# Patient Record
Sex: Male | Born: 1975 | Race: White | Hispanic: No | Marital: Single | Smoking: Former smoker
Health system: Southern US, Community
[De-identification: ages and names within clinical notes are randomized; demographics above are authoritative.]

## PROBLEM LIST (undated history)

## (undated) DIAGNOSIS — Z86718 Personal history of other venous thrombosis and embolism: Secondary | ICD-10-CM

## (undated) DIAGNOSIS — R7989 Other specified abnormal findings of blood chemistry: Secondary | ICD-10-CM

## (undated) DIAGNOSIS — R7303 Prediabetes: Secondary | ICD-10-CM

## (undated) DIAGNOSIS — G9332 Myalgic encephalomyelitis/chronic fatigue syndrome: Secondary | ICD-10-CM

## (undated) DIAGNOSIS — E7800 Pure hypercholesterolemia, unspecified: Secondary | ICD-10-CM

## (undated) DIAGNOSIS — L709 Acne, unspecified: Secondary | ICD-10-CM

## (undated) DIAGNOSIS — K760 Fatty (change of) liver, not elsewhere classified: Secondary | ICD-10-CM

## (undated) DIAGNOSIS — R0602 Shortness of breath: Secondary | ICD-10-CM

## (undated) DIAGNOSIS — M2550 Pain in unspecified joint: Secondary | ICD-10-CM

## (undated) DIAGNOSIS — J80 Acute respiratory distress syndrome: Secondary | ICD-10-CM

## (undated) DIAGNOSIS — M549 Dorsalgia, unspecified: Secondary | ICD-10-CM

## (undated) DIAGNOSIS — M1990 Unspecified osteoarthritis, unspecified site: Secondary | ICD-10-CM

## (undated) DIAGNOSIS — R350 Frequency of micturition: Secondary | ICD-10-CM

## (undated) DIAGNOSIS — G4730 Sleep apnea, unspecified: Secondary | ICD-10-CM

## (undated) HISTORY — DX: Personal history of other venous thrombosis and embolism: Z86.718

## (undated) HISTORY — DX: Shortness of breath: R06.02

## (undated) HISTORY — DX: Acute respiratory distress syndrome: J80

## (undated) HISTORY — DX: Other specified abnormal findings of blood chemistry: R79.89

## (undated) HISTORY — DX: Prediabetes: R73.03

## (undated) HISTORY — DX: Pain in unspecified joint: M25.50

## (undated) HISTORY — PX: CORONARY ARTERY BYPASS GRAFT: SHX141

## (undated) HISTORY — DX: Myalgic encephalomyelitis/chronic fatigue syndrome: G93.32

## (undated) HISTORY — DX: Dorsalgia, unspecified: M54.9

## (undated) HISTORY — DX: Fatty (change of) liver, not elsewhere classified: K76.0

## (undated) HISTORY — DX: Acne, unspecified: L70.9

## (undated) HISTORY — DX: Sleep apnea, unspecified: G47.30

## (undated) HISTORY — DX: Pure hypercholesterolemia, unspecified: E78.00

## (undated) HISTORY — DX: Frequency of micturition: R35.0

## (undated) HISTORY — DX: Unspecified osteoarthritis, unspecified site: M19.90

---

## 1997-12-12 HISTORY — PX: INGUINAL HERNIA REPAIR: SUR1180

## 2015-08-28 ENCOUNTER — Encounter: Payer: 59 | Admitting: Vascular Surgery

## 2017-04-22 DIAGNOSIS — R0602 Shortness of breath: Secondary | ICD-10-CM | POA: Diagnosis not present

## 2017-04-22 DIAGNOSIS — E871 Hypo-osmolality and hyponatremia: Secondary | ICD-10-CM | POA: Diagnosis not present

## 2017-04-22 DIAGNOSIS — R061 Stridor: Secondary | ICD-10-CM | POA: Diagnosis not present

## 2017-04-22 DIAGNOSIS — F17210 Nicotine dependence, cigarettes, uncomplicated: Secondary | ICD-10-CM | POA: Diagnosis not present

## 2017-04-23 DIAGNOSIS — F17210 Nicotine dependence, cigarettes, uncomplicated: Secondary | ICD-10-CM | POA: Diagnosis not present

## 2017-04-23 DIAGNOSIS — R061 Stridor: Secondary | ICD-10-CM | POA: Diagnosis not present

## 2017-04-23 DIAGNOSIS — E871 Hypo-osmolality and hyponatremia: Secondary | ICD-10-CM | POA: Diagnosis not present

## 2017-04-23 DIAGNOSIS — R0602 Shortness of breath: Secondary | ICD-10-CM | POA: Diagnosis not present

## 2017-04-24 DIAGNOSIS — R0602 Shortness of breath: Secondary | ICD-10-CM | POA: Diagnosis not present

## 2017-04-24 DIAGNOSIS — R061 Stridor: Secondary | ICD-10-CM | POA: Diagnosis not present

## 2017-04-24 DIAGNOSIS — E871 Hypo-osmolality and hyponatremia: Secondary | ICD-10-CM | POA: Diagnosis not present

## 2017-04-24 DIAGNOSIS — F17210 Nicotine dependence, cigarettes, uncomplicated: Secondary | ICD-10-CM | POA: Diagnosis not present

## 2017-04-25 ENCOUNTER — Inpatient Hospital Stay (HOSPITAL_COMMUNITY)
Admission: AD | Admit: 2017-04-25 | Discharge: 2017-05-07 | DRG: 207 | Disposition: A | Discharge status: Home / Self Care | Payer: 59 | Source: Other Acute Inpatient Hospital | Attending: Internal Medicine | Admitting: Internal Medicine

## 2017-04-25 DIAGNOSIS — Z452 Encounter for adjustment and management of vascular access device: Secondary | ICD-10-CM

## 2017-04-25 DIAGNOSIS — K760 Fatty (change of) liver, not elsewhere classified: Secondary | ICD-10-CM | POA: Diagnosis present

## 2017-04-25 DIAGNOSIS — K859 Acute pancreatitis without necrosis or infection, unspecified: Secondary | ICD-10-CM | POA: Diagnosis present

## 2017-04-25 DIAGNOSIS — F05 Delirium due to known physiological condition: Secondary | ICD-10-CM | POA: Diagnosis present

## 2017-04-25 DIAGNOSIS — E870 Hyperosmolality and hypernatremia: Secondary | ICD-10-CM | POA: Diagnosis present

## 2017-04-25 DIAGNOSIS — G9341 Metabolic encephalopathy: Secondary | ICD-10-CM | POA: Diagnosis present

## 2017-04-25 DIAGNOSIS — Z01818 Encounter for other preprocedural examination: Secondary | ICD-10-CM

## 2017-04-25 DIAGNOSIS — J9690 Respiratory failure, unspecified, unspecified whether with hypoxia or hypercapnia: Secondary | ICD-10-CM

## 2017-04-25 DIAGNOSIS — J80 Acute respiratory distress syndrome: Secondary | ICD-10-CM | POA: Diagnosis present

## 2017-04-25 DIAGNOSIS — A419 Sepsis, unspecified organism: Secondary | ICD-10-CM | POA: Diagnosis present

## 2017-04-25 DIAGNOSIS — D6489 Other specified anemias: Secondary | ICD-10-CM | POA: Diagnosis present

## 2017-04-25 DIAGNOSIS — A400 Sepsis due to streptococcus, group A: Secondary | ICD-10-CM | POA: Diagnosis not present

## 2017-04-25 DIAGNOSIS — J154 Pneumonia due to other streptococci: Secondary | ICD-10-CM | POA: Diagnosis present

## 2017-04-25 DIAGNOSIS — F17210 Nicotine dependence, cigarettes, uncomplicated: Secondary | ICD-10-CM | POA: Diagnosis not present

## 2017-04-25 DIAGNOSIS — E662 Morbid (severe) obesity with alveolar hypoventilation: Secondary | ICD-10-CM | POA: Diagnosis present

## 2017-04-25 DIAGNOSIS — F17200 Nicotine dependence, unspecified, uncomplicated: Secondary | ICD-10-CM | POA: Diagnosis present

## 2017-04-25 DIAGNOSIS — R748 Abnormal levels of other serum enzymes: Secondary | ICD-10-CM | POA: Diagnosis not present

## 2017-04-25 DIAGNOSIS — Z6839 Body mass index (BMI) 39.0-39.9, adult: Secondary | ICD-10-CM

## 2017-04-25 DIAGNOSIS — Z86718 Personal history of other venous thrombosis and embolism: Secondary | ICD-10-CM | POA: Diagnosis not present

## 2017-04-25 DIAGNOSIS — Z4659 Encounter for fitting and adjustment of other gastrointestinal appliance and device: Secondary | ICD-10-CM

## 2017-04-25 DIAGNOSIS — R0602 Shortness of breath: Secondary | ICD-10-CM | POA: Diagnosis not present

## 2017-04-25 DIAGNOSIS — Z881 Allergy status to other antibiotic agents status: Secondary | ICD-10-CM

## 2017-04-25 DIAGNOSIS — G4733 Obstructive sleep apnea (adult) (pediatric): Secondary | ICD-10-CM | POA: Diagnosis not present

## 2017-04-25 DIAGNOSIS — Z978 Presence of other specified devices: Secondary | ICD-10-CM

## 2017-04-25 DIAGNOSIS — Z781 Physical restraint status: Secondary | ICD-10-CM

## 2017-04-25 DIAGNOSIS — E871 Hypo-osmolality and hyponatremia: Secondary | ICD-10-CM | POA: Diagnosis not present

## 2017-04-25 DIAGNOSIS — J189 Pneumonia, unspecified organism: Secondary | ICD-10-CM

## 2017-04-25 DIAGNOSIS — J9601 Acute respiratory failure with hypoxia: Secondary | ICD-10-CM | POA: Diagnosis not present

## 2017-04-25 DIAGNOSIS — E876 Hypokalemia: Secondary | ICD-10-CM | POA: Diagnosis present

## 2017-04-25 DIAGNOSIS — D490 Neoplasm of unspecified behavior of digestive system: Secondary | ICD-10-CM

## 2017-04-25 DIAGNOSIS — K7689 Other specified diseases of liver: Secondary | ICD-10-CM | POA: Diagnosis present

## 2017-04-25 DIAGNOSIS — J181 Lobar pneumonia, unspecified organism: Secondary | ICD-10-CM | POA: Diagnosis not present

## 2017-04-25 DIAGNOSIS — J020 Streptococcal pharyngitis: Secondary | ICD-10-CM | POA: Diagnosis present

## 2017-04-25 DIAGNOSIS — R061 Stridor: Secondary | ICD-10-CM | POA: Diagnosis not present

## 2017-04-25 DIAGNOSIS — R160 Hepatomegaly, not elsewhere classified: Secondary | ICD-10-CM | POA: Diagnosis not present

## 2017-04-25 MED ORDER — FENTANYL CITRATE (PF) 100 MCG/2ML IJ SOLN: 100.0000 ug | INTRAMUSCULAR | Status: DC | PRN

## 2017-04-25 MED ORDER — PROPOFOL 1000 MG/100ML IV EMUL
0.0000 ug/kg/min | INTRAVENOUS | Status: DC
  Administered 2017-04-26: 30 ug/kg/min via INTRAVENOUS

## 2017-04-25 MED ORDER — HEPARIN SODIUM (PORCINE) 5000 UNIT/ML IJ SOLN
5000.0000 [IU] | Freq: Three times a day (TID) | INTRAMUSCULAR | Status: DC
  Administered 2017-04-26 – 2017-05-01 (×16): 5000 [IU] via SUBCUTANEOUS
  Filled 2017-04-25 (×18): qty 1

## 2017-04-25 MED ORDER — SODIUM CHLORIDE 0.9 % IV SOLN
250.0000 mL | INTRAVENOUS | Status: DC | PRN
  Administered 2017-04-27 – 2017-04-29 (×2): 250 mL via INTRAVENOUS

## 2017-04-25 MED ORDER — PANTOPRAZOLE SODIUM 40 MG IV SOLR
40.0000 mg | Freq: Every day | INTRAVENOUS | Status: DC
  Administered 2017-04-26 – 2017-04-27 (×2): 40 mg via INTRAVENOUS
  Filled 2017-04-25 (×3): qty 40

## 2017-04-25 NOTE — H&P (Signed)
PULMONARY / CRITICAL CARE MEDICINE   Name: Richard Dyer MRN: [id number] DOB: 01-May-1975    ADMISSION DATE:  04/25/2017 CONSULTATION DATE:  04/25/2017  REFERRING MD:  [facility]   CHIEF COMPLAINT:  Hypoxic Respiratory Failure   HISTORY OF PRESENT ILLNESS:   42 year old male current smoker with PMH of remote DVT.   Presented to ED on 3/12 with one day of fever, sore throat, dyspnea. In ED Tmax 102, rapid strep positive, flu negative. Family noted patient has had recent exposure to flu. CT neck with no peritonsillar abscess, however, patient stridorous. Intubated. Placed on ceftriaxone/clindamycin and dexamethasone. Extubated after 36 hours, however later that evening became hypoxic and tachypneic requiring re-intubation. CT chest with dense bilateral airspace disease with air bronchograms. Transferred to [facility] for further work-up.    PAST MEDICAL HISTORY :  He  has no past medical history on file.  PAST SURGICAL HISTORY: He  has no past surgical history on file.  Allergies not on file  No current facility-administered medications on file prior to encounter.    No current outpatient medications on file prior to encounter.    FAMILY HISTORY:  His has no family status information on file.    SOCIAL HISTORY: He    REVIEW OF SYSTEMS:   Unable to review as patient is intubated and sedated   SUBJECTIVE:   VITAL SIGNS: BP 110/71   Pulse 97   Temp (!) 101.1 F (38.4 C)   Resp (!) 24   Ht 5\' 10"  (1.778 m)   SpO2 92%   HEMODYNAMICS:    VENTILATOR SETTINGS: Vent Mode: PRVC FiO2 (%):  [80 %] 80 % Set Rate:  [18 bmp] 18 bmp PEEP:  [12 cmH20] 12 cmH20 Plateau Pressure:  [29 cmH20] 29 cmH20  INTAKE / OUTPUT: No intake/output data recorded.  PHYSICAL EXAMINATION: General:  Adult male, on vent  Neuro:  Sedated, pupils intact HEENT:  ETT in place, dry MM  Cardiovascular:  RRR, no MRG  Lungs:  Diminished breath sounds, no wheeze  Abdomen:  Obese, active  bowel sounds  Musculoskeletal:  -edema,  Skin:  Warm, dry   LABS:  BMET No results for input(s): NA, K, CL, CO2, BUN, CREATININE, GLUCOSE in the last 168 hours.  Electrolytes No results for input(s): CALCIUM, MG, PHOS in the last 168 hours.  CBC No results for input(s): WBC, HGB, HCT, PLT in the last 168 hours.  Coag's No results for input(s): APTT, INR in the last 168 hours.  Sepsis Markers No results for input(s): LATICACIDVEN, PROCALCITON, O2SATVEN in the last 168 hours.  ABG No results for input(s): PHART, PCO2ART, PO2ART in the last 168 hours.  Liver Enzymes No results for input(s): AST, ALT, ALKPHOS, BILITOT, ALBUMIN in the last 168 hours.  Cardiac Enzymes No results for input(s): TROPONINI, PROBNP in the last 168 hours.  Glucose No results for input(s): GLUCAP in the last 168 hours.  Imaging No results found.   STUDIES:  CT Chest 3/15 > Dense Confluent Bilateral airspace disease with air bronchograms is suggestive of severe pulmonary edema/ARDS. Diffuse pulmonary hemorrhage or infection could have similar pattern, no mediastinal lymphadenopathy, no mass in the airway or lung   CULTURES: Blood 3/16 >> Sputum 3/16 >>  U/A 3/16 >>   ANTIBIOTICS: Ceftriaxone 3/12 >3/15 Clindamycin 3/12 >3/15 Vancomycin 3/15 >>   Cefepime 3/15 >> Azithromycin 3/15 >>   SIGNIFICANT EVENTS: 3/12 > Presented to ED, Intubated  3/14 > Extubated  3/15 > Re-intubated  3/15 > Transferred to [facility]   LINES/TUBES: ETT 3/12 > 3/14 ETT 3/14 >>   DISCUSSION: Presented to ED on 3/12 with one day of fever, sore throat, dyspnea. In ED Tmax 102, rapid strep positive, flu negative. CT neck with no peritonsillar abscess, however, patient stridorous. Intubated. Placed on ceftriaxone/clindamycin and dexamethasone. Extubated after 36 hours, however later that evening became hypoxic and tachypneic requiring re-intubation. CT chest with dense bilateral airspace disease with air  bronchograms.   ASSESSMENT / PLAN:  PULMONARY A: Acute Hypoxic Respiratory Failure in setting of Severe ARDS secondary to presumed CAP, +Strep A with noted pharyngitis  PO2/Fi02 = 100  H/O Tobacco Abuse  P:   Vent Support > 6cc/kg  Trend ABG/CXR Pulmonary Hygiene  RVP pending   CARDIOVASCULAR A:  H/O DVT - Wells Score 9.0  P:  Cardiac Monitoring  Maintain MAP > 65 ECHO/LE Dopplers pending CT PE pending   RENAL A:   Hypokalemia  P:   Trend BMP  Replace electrolytes as indicated > Replace K   GASTROINTESTINAL A:   SUP P:   NPO PPI  HEMATOLOGIC A:   DVT Prophylaxis  P:  Trend CBC  SCDs  Heparin SQ  INFECTIOUS A:   Rapid Strep A positive (OSH believed this was a colonization)  Febrile, Leukocytosis  P:   Trend WBC and Fever  Trend LA and PCT  PAN Culture  RVP pending   ENDOCRINE A:   No issues    P:   Trend Glucose   NEUROLOGIC A:   Sedation Needs  P:   RASS goal: 0/-1 Wean Fentanyl gtt to achieve RASS PRN Versed    FAMILY  - Updates: Wife updated at bedside   - Inter-disciplinary family meet or Palliative Care meeting due by:  3/23     Hayden Pedro, AGACNP-BC [place]  Pgr: [phone]  PCCM Pgr: [phone]

## 2017-04-26 ENCOUNTER — Inpatient Hospital Stay (HOSPITAL_COMMUNITY): Payer: 59

## 2017-04-26 DIAGNOSIS — Z978 Presence of other specified devices: Secondary | ICD-10-CM

## 2017-04-26 DIAGNOSIS — R0602 Shortness of breath: Secondary | ICD-10-CM

## 2017-04-26 DIAGNOSIS — J80 Acute respiratory distress syndrome: Secondary | ICD-10-CM

## 2017-04-26 LAB — GLUCOSE, CAPILLARY
GLUCOSE-CAPILLARY: 113 mg/dL — AB (ref 65–99)
GLUCOSE-CAPILLARY: 130 mg/dL — AB (ref 65–99)
Glucose-Capillary: 131 mg/dL — ABNORMAL HIGH (ref 65–99)
Glucose-Capillary: 119 mg/dL — ABNORMAL HIGH (ref 65–99)
Glucose-Capillary: 145 mg/dL — ABNORMAL HIGH (ref 65–99)

## 2017-04-26 LAB — BLOOD GAS, ARTERIAL
ACID-BASE EXCESS: 5 mmol/L — AB (ref 0.0–2.0)
ACID-BASE EXCESS: 6.1 mmol/L — AB (ref 0.0–2.0)
Acid-Base Excess: 4.9 mmol/L — ABNORMAL HIGH (ref 0.0–2.0)
BICARBONATE: 29.4 mmol/L — AB (ref 20.0–28.0)
Bicarbonate: 30.1 mmol/L — ABNORMAL HIGH (ref 20.0–28.0)
Bicarbonate: 29 mmol/L — ABNORMAL HIGH (ref 20.0–28.0)
DRAWN BY: 414221
Drawn by: 51702
Drawn by: 414221
FIO2: 60
FIO2: 70
FIO2: 70
LHR: 18 {breaths}/min
MECHVT: 580 mL
O2 SAT: 94.6 %
O2 Saturation: 89.8 %
O2 Saturation: 95 %
PCO2 ART: 46.7 mmHg (ref 32.0–48.0)
PEEP/CPAP: 12 cmH2O
PEEP: 12 cmH2O
PEEP: 12 cmH2O
PH ART: 7.458 — AB (ref 7.350–7.450)
Patient temperature: 98.6
Patient temperature: 100.8
Patient temperature: 102.4
RATE: 18 resp/min
RATE: 18 resp/min
VT: 440 mL
VT: 440 mL
pCO2 arterial: 43 mmHg (ref 32.0–48.0)
pCO2 arterial: 51.9 mmHg — ABNORMAL HIGH (ref 32.0–48.0)
pH, Arterial: 7.417 (ref 7.350–7.450)
pH, Arterial: 7.384 (ref 7.350–7.450)
pO2, Arterial: 69.9 mmHg — ABNORMAL LOW (ref 83.0–108.0)
pO2, Arterial: 61 mmHg — ABNORMAL LOW (ref 83.0–108.0)
pO2, Arterial: 85.8 mmHg (ref 83.0–108.0)

## 2017-04-26 LAB — LACTIC ACID, PLASMA: LACTIC ACID, VENOUS: 1.2 mmol/L (ref 0.5–1.9)

## 2017-04-26 LAB — URINALYSIS, ROUTINE W REFLEX MICROSCOPIC
BILIRUBIN URINE: NEGATIVE
Glucose, UA: NEGATIVE mg/dL
Ketones, ur: NEGATIVE mg/dL
LEUKOCYTES UA: NEGATIVE
NITRITE: NEGATIVE
PH: 5 (ref 5.0–8.0)
Protein, ur: 100 mg/dL — AB
SPECIFIC GRAVITY, URINE: 1.035 — AB (ref 1.005–1.030)

## 2017-04-26 LAB — CBC
HCT: 43.3 % (ref 39.0–52.0)
Hemoglobin: 15.4 g/dL (ref 13.0–17.0)
MCH: 31.4 pg (ref 26.0–34.0)
MCHC: 35.6 g/dL (ref 30.0–36.0)
MCV: 88.2 fL (ref 78.0–100.0)
PLATELETS: 246 10*3/uL (ref 150–400)
RBC: 4.91 MIL/uL (ref 4.22–5.81)
RDW: 13.2 % (ref 11.5–15.5)
WBC: 15.7 10*3/uL — ABNORMAL HIGH (ref 4.0–10.5)

## 2017-04-26 LAB — BASIC METABOLIC PANEL
ANION GAP: 15 (ref 5–15)
BUN: 15 mg/dL (ref 6–20)
CALCIUM: 7.9 mg/dL — AB (ref 8.9–10.3)
CO2: 25 mmol/L (ref 22–32)
CREATININE: 1.19 mg/dL (ref 0.61–1.24)
Chloride: 97 mmol/L — ABNORMAL LOW (ref 101–111)
GFR calc Af Amer: >60 mL/min (ref >60)
GLUCOSE: 116 mg/dL — AB (ref 65–99)
Potassium: 3.2 mmol/L — ABNORMAL LOW (ref 3.5–5.1)
Sodium: 137 mmol/L (ref 135–145)

## 2017-04-26 LAB — RESPIRATORY PANEL BY PCR
ADENOVIRUS-RVPPCR: NOT DETECTED
Bordetella pertussis: NOT DETECTED
CHLAMYDOPHILA PNEUMONIAE-RVPPCR: NOT DETECTED
CORONAVIRUS NL63-RVPPCR: NOT DETECTED
CORONAVIRUS OC43-RVPPCR: NOT DETECTED
Coronavirus 229E: NOT DETECTED
Coronavirus HKU1: NOT DETECTED
INFLUENZA A-RVPPCR: NOT DETECTED
Influenza B: NOT DETECTED
Metapneumovirus: NOT DETECTED
Mycoplasma pneumoniae: NOT DETECTED
PARAINFLUENZA VIRUS 3-RVPPCR: NOT DETECTED
Parainfluenza Virus 1: NOT DETECTED
Parainfluenza Virus 2: NOT DETECTED
Parainfluenza Virus 4: NOT DETECTED
RESPIRATORY SYNCYTIAL VIRUS-RVPPCR: NOT DETECTED
Rhinovirus / Enterovirus: NOT DETECTED

## 2017-04-26 LAB — TRIGLYCERIDES: Triglycerides: 241 mg/dL — ABNORMAL HIGH (ref <150)

## 2017-04-26 LAB — HEPATIC FUNCTION PANEL
ALT: 19 U/L (ref 17–63)
AST: 19 U/L (ref 15–41)
Albumin: 3.1 g/dL — ABNORMAL LOW (ref 3.5–5.0)
Alkaline Phosphatase: 53 U/L (ref 38–126)
BILIRUBIN DIRECT: 0.5 mg/dL (ref 0.1–0.5)
BILIRUBIN INDIRECT: 1 mg/dL — AB (ref 0.3–0.9)
BILIRUBIN TOTAL: 1.5 mg/dL — AB (ref 0.3–1.2)
TOTAL PROTEIN: 6.8 g/dL (ref 6.5–8.1)

## 2017-04-26 LAB — ECHOCARDIOGRAM COMPLETE
HEIGHTINCHES: 70 in
Weight: 4409.2 oz

## 2017-04-26 LAB — PROCALCITONIN: Procalcitonin: 0.48 ng/mL

## 2017-04-26 LAB — MRSA PCR SCREENING: MRSA BY PCR: NEGATIVE

## 2017-04-26 LAB — STREP PNEUMONIAE URINARY ANTIGEN: STREP PNEUMO URINARY ANTIGEN: NEGATIVE

## 2017-04-26 LAB — MAGNESIUM: MAGNESIUM: 2.2 mg/dL (ref 1.7–2.4)

## 2017-04-26 LAB — HIV ANTIBODY (ROUTINE TESTING W REFLEX): HIV Screen 4th Generation wRfx: NONREACTIVE

## 2017-04-26 LAB — PHOSPHORUS: PHOSPHORUS: 3.2 mg/dL (ref 2.5–4.6)

## 2017-04-26 LAB — TROPONIN I: Troponin I: <0.03 ng/mL (ref <0.03)

## 2017-04-26 MED ORDER — PENICILLIN G BENZATHINE 1200000 UNIT/2ML IM SUSP
1.2000 10*6.[IU] | Freq: Once | INTRAMUSCULAR | Status: AC
  Administered 2017-04-26: 1.2 10*6.[IU] via INTRAMUSCULAR
  Filled 2017-04-26: qty 2

## 2017-04-26 MED ORDER — MIDAZOLAM HCL 2 MG/2ML IJ SOLN
2.0000 mg | INTRAMUSCULAR | Status: DC | PRN
  Administered 2017-04-26 – 2017-04-27 (×3): 2 mg via INTRAVENOUS
  Filled 2017-04-26 (×3): qty 2

## 2017-04-26 MED ORDER — ACETAMINOPHEN 160 MG/5ML PO SOLN
650.0000 mg | Freq: Four times a day (QID) | ORAL | Status: DC | PRN
  Administered 2017-04-26 – 2017-04-29 (×4): 650 mg
  Filled 2017-04-26 (×4): qty 20.3

## 2017-04-26 MED ORDER — SODIUM CHLORIDE 0.9 % IV SOLN
INTRAVENOUS | Status: DC | PRN
  Administered 2017-05-01 – 2017-05-04 (×2): via INTRA_ARTERIAL

## 2017-04-26 MED ORDER — SODIUM CHLORIDE 0.9 % IV BOLUS (SEPSIS)
1000.0000 mL | Freq: Once | INTRAVENOUS | Status: AC
  Administered 2017-04-26: 1000 mL via INTRAVENOUS

## 2017-04-26 MED ORDER — DIPHENHYDRAMINE HCL 50 MG/ML IJ SOLN
25.0000 mg | Freq: Three times a day (TID) | INTRAMUSCULAR | Status: DC
  Administered 2017-04-26 – 2017-04-27 (×4): 25 mg via INTRAVENOUS
  Filled 2017-04-26: qty 1
  Filled 2017-04-26 (×5): qty 0.5
  Filled 2017-04-26: qty 1
  Filled 2017-04-26 (×2): qty 0.5
  Filled 2017-04-26: qty 1

## 2017-04-26 MED ORDER — FENTANYL BOLUS VIA INFUSION
50.0000 ug | INTRAVENOUS | Status: DC | PRN
  Administered 2017-04-26 – 2017-04-27 (×2): 50 ug via INTRAVENOUS
  Filled 2017-04-26: qty 50

## 2017-04-26 MED ORDER — IOPAMIDOL (ISOVUE-370) INJECTION 76%
INTRAVENOUS | Status: AC
  Administered 2017-04-26: 100 mL
  Filled 2017-04-26: qty 100

## 2017-04-26 MED ORDER — FENTANYL CITRATE (PF) 100 MCG/2ML IJ SOLN
50.0000 ug | Freq: Once | INTRAMUSCULAR | Status: AC
  Administered 2017-04-26: 50 ug via INTRAVENOUS
  Filled 2017-04-26 (×2): qty 2

## 2017-04-26 MED ORDER — CHLORHEXIDINE GLUCONATE 0.12% ORAL RINSE (MEDLINE KIT)
15.0000 mL | Freq: Two times a day (BID) | OROMUCOSAL | Status: DC
  Administered 2017-04-26 – 2017-05-02 (×13): 15 mL via OROMUCOSAL

## 2017-04-26 MED ORDER — ORAL CARE MOUTH RINSE
15.0000 mL | OROMUCOSAL | Status: DC
  Administered 2017-04-26 – 2017-05-02 (×64): 15 mL via OROMUCOSAL

## 2017-04-26 MED ORDER — VANCOMYCIN HCL 10 G IV SOLR
1250.0000 mg | Freq: Three times a day (TID) | INTRAVENOUS | Status: DC
  Administered 2017-04-26 – 2017-04-27 (×4): 1250 mg via INTRAVENOUS
  Filled 2017-04-26 (×5): qty 1250

## 2017-04-26 MED ORDER — POTASSIUM CHLORIDE 20 MEQ/15ML (10%) PO SOLN
40.0000 meq | Freq: Once | ORAL | Status: AC
  Administered 2017-04-26: 40 meq via ORAL
  Filled 2017-04-26: qty 30

## 2017-04-26 MED ORDER — PRO-STAT SUGAR FREE PO LIQD
30.0000 mL | Freq: Every day | ORAL | Status: DC
  Administered 2017-04-26 – 2017-05-02 (×7): 30 mL
  Filled 2017-04-26 (×7): qty 30

## 2017-04-26 MED ORDER — VITAL HIGH PROTEIN PO LIQD
1000.0000 mL | ORAL | Status: DC
  Administered 2017-04-26 – 2017-05-01 (×11): 1000 mL
  Filled 2017-04-26 (×4): qty 1000

## 2017-04-26 MED ORDER — MIDAZOLAM HCL 2 MG/2ML IJ SOLN: 2.0000 mg | INTRAMUSCULAR | Status: DC | PRN

## 2017-04-26 MED ORDER — FENTANYL 2500MCG IN NS 250ML (10MCG/ML) PREMIX INFUSION
25.0000 ug/h | INTRAVENOUS | Status: DC
  Administered 2017-04-26: 25 ug/h via INTRAVENOUS
  Administered 2017-04-27 (×2): 300 ug/h via INTRAVENOUS
  Administered 2017-04-27: 400 ug/h via INTRAVENOUS
  Filled 2017-04-26 (×3): qty 250

## 2017-04-26 MED ORDER — PIPERACILLIN-TAZOBACTAM 3.375 G IVPB
3.3750 g | Freq: Three times a day (TID) | INTRAVENOUS | Status: AC
  Administered 2017-04-26 – 2017-05-01 (×18): 3.375 g via INTRAVENOUS
  Filled 2017-04-26 (×18): qty 50

## 2017-04-26 NOTE — Progress Notes (Signed)
  Echocardiogram 2D Echocardiogram has been performed.  Richard Dyer 04/26/2017, 9:14 AM

## 2017-04-26 NOTE — Progress Notes (Signed)
Pharmacy Antibiotic Note  Richard Dyer is a 42 y.o. male admitted on 04/25/2017 with respiratory failure/ARDS.  Pharmacy has been consulted for Vancomycin and Zosyn dosing.  Previously receiving Vancomycin Cefepime and Azithromycin at [facility]. Last dose of Vancomycin 2 g IV given at 1300 3/15.   Plan: Vancomycin 1250 mg IV q8h, premedicate with Benadryl ddue to h/o infusion reaction Zosyn 3.375 g IV q8h    Height: 5\' 10"  (177.8 cm) Weight: 277 lb 1.9 oz (125.7 kg) IBW/kg (Calculated) : 73  Temp (24hrs), Avg:100.8 F (38.2 C), Min:100.6 F (38.1 C), Max:101.1 F (38.4 C)  Recent Labs  Lab 04/26/17 0037 04/26/17 0039  WBC  --  15.7*  CREATININE  --  1.19  LATICACIDVEN 1.2  --     Estimated Creatinine Clearance: 110.9 mL/min (by C-G formula based on SCr of 1.19 mg/dL).    Allergies  Allergen Reactions  . 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

## 2017-04-26 NOTE — Progress Notes (Addendum)
PULMONARY / CRITICAL CARE MEDICINE   Name: Richard Dyer MRN: [id number] DOB: 08-22-1975    ADMISSION DATE:  04/25/2017 CONSULTATION DATE:  04/25/2017  REFERRING MD:  [facility]   CHIEF COMPLAINT:  Hypoxic Respiratory Failure   HISTORY OF PRESENT ILLNESS:   42 year old male current smoker with PMH of remote DVT.   Presented to ED on 3/12 with one day of fever, sore throat, dyspnea. In ED Tmax 102, rapid strep positive, flu negative. Family noted patient has had recent exposure to flu. CT neck with no peritonsillar abscess, however, patient stridorous. Intubated. Placed on ceftriaxone/clindamycin and dexamethasone. Extubated after 36 hours, however later that evening became hypoxic and tachypneic requiring re-intubation. CT chest with dense bilateral airspace disease with air bronchograms. Transferred to [facility] for further work-up.      SUBJECTIVE:  No male who is on low stretch via ARDS protocol currently stable VITAL SIGNS: BP 140/67   Pulse 81   Temp (!) 100.9 F (38.3 C)   Resp (!) 28   Ht 5\' 10"  (1.778 m)   Wt 125 kg (275 lb 9.2 oz)   SpO2 90%   BMI 39.54 kg/m   HEMODYNAMICS:    VENTILATOR SETTINGS: Vent Mode: PRVC FiO2 (%):  [60 %-80 %] 60 % Set Rate:  [18 bmp] 18 bmp Vt Set:  [440 mL-580 mL] 440 mL PEEP:  [12 cmH20] 12 cmH20 Plateau Pressure:  [25 cmH20-29 cmH20] 25 cmH20  INTAKE / OUTPUT: I/O last 3 completed shifts: In: 1413.7 [I.V.:113.7; IV [id number]:7425] Out: 956 [Urine:495]  PHYSICAL EXAMINATION: General: Obese male in no acute distress HEENT: Endotracheal tube and orogastric tube in place, pupils equal reactive PSY: Sedated Neuro: Follows commands moves all extremities CV: Heart sounds are regular S1s2 rrr, no m/r/g PULM: Coarse rhonchi with faint expiratory wheezes LO:VFIE, non-tender, bsx4 active  Extremities: warm/dry, 1+ edema  Skin: no rashes or lesions    LABS:  BMET Recent Labs  Lab 04/26/17 0039  NA 137  K 3.2*  CL  97*  CO2 25  BUN 15  CREATININE 1.19  GLUCOSE 116*    Electrolytes Recent Labs  Lab 04/26/17 0039  CALCIUM 7.9*  MG 2.2  PHOS 3.2    CBC Recent Labs  Lab 04/26/17 0039  WBC 15.7*  HGB 15.4  HCT 43.3  PLT 246    Coag's No results for input(s): APTT, INR in the last 168 hours.  Sepsis Markers Recent Labs  Lab 04/26/17 0037  LATICACIDVEN 1.2  PROCALCITON 0.48    ABG Recent Labs  Lab 04/25/17 0040 04/26/17 0437  PHART 7.458* 7.417  PCO2ART 43.0 46.7  PO2ART 69.9* 61.0*    Liver Enzymes Recent Labs  Lab 04/26/17 0037  AST 19  ALT 19  ALKPHOS 53  BILITOT 1.5*  ALBUMIN 3.1*    Cardiac Enzymes Recent Labs  Lab 04/26/17 0037  TROPONINI <0.03    Glucose Recent Labs  Lab 04/26/17 0406 04/26/17 0831 04/26/17 1215  GLUCAP 131* 119* 113*    Imaging Ct Angio Chest Pe W Or Wo Contrast  Result Date: 04/26/2017 CLINICAL DATA:  Acute onset of hypoxic respiratory failure. EXAM: CT ANGIOGRAPHY CHEST WITH CONTRAST TECHNIQUE: Multidetector CT imaging of the chest was performed using the standard protocol during bolus administration of intravenous contrast. Multiplanar CT image reconstructions and MIPs were obtained to evaluate the vascular anatomy. CONTRAST:  112mL ISOVUE-370 IOPAMIDOL (ISOVUE-370) INJECTION 76% COMPARISON:  Chest radiograph performed earlier today at 12:00 a.m. FINDINGS: Cardiovascular: There is  no evidence of central pulmonary embolus. Evaluation for pulmonary embolus is suboptimal in areas of airspace consolidation. The heart is borderline normal in size. The thoracic aorta is grossly unremarkable. The great vessels are within normal limits. Mediastinum/Nodes: The mediastinum is grossly unremarkable in appearance. No mediastinal lymphadenopathy is seen. Trace pericardial fluid remains within normal limits. The visualized portions of the thyroid gland are unremarkable. No axillary lymphadenopathy is seen. The patient's endotracheal tube is seen  ending 4 cm above the carina. Lungs/Pleura: Diffuse bilateral airspace opacification is noted, with additional hazy airspace opacity in the expanded portions of the lungs. This may reflect pulmonary edema or pneumonia. Given clinical presentation, ARDS is a concern. No definite pleural effusion or pneumothorax is seen. Upper Abdomen: The visualized portions of the liver and spleen are unremarkable. The visualized portions of the pancreas, gallbladder, adrenal glands and kidneys are within normal limits. An enteric tube is noted extending to the body of the stomach. Musculoskeletal: No acute osseous abnormalities are identified. The visualized musculature is unremarkable in appearance. Review of the MIP images confirms the above findings. IMPRESSION: 1. No evidence of central pulmonary embolus. 2. Diffuse bilateral airspace opacification, with additional hazy airspace opacity in the expanded portions of the lungs. This may reflect pulmonary edema or pneumonia. Given clinical presentation, ARDS is a concern. Electronically Signed   By: Garald Balding M.D.   On: 04/26/2017 03:30   Dg Chest Port 1 View  Result Date: 04/26/2017 CLINICAL DATA:  Endotracheal and orogastric tube placement EXAM: PORTABLE CHEST 1 VIEW COMPARISON:  None. FINDINGS: There is moderate cardiomegaly with multifocal bilateral airspace opacities. The endotracheal tube tip is at the level of the clavicular heads. Orogastric tube side port overlies the gastric body. IMPRESSION: 1. Endotracheal tube and orogastric tube in radiographically appropriate position. 2. Moderate cardiomegaly with multifocal bilateral airspace opacities which may indicate multifocal infection or pulmonary edema. Electronically Signed   By: Ulyses Jarred M.D.   On: 04/26/2017 00:25     STUDIES:  CT Chest 3/15 > Dense Confluent Bilateral airspace disease with air bronchograms is suggestive of severe pulmonary edema/ARDS. Diffuse pulmonary hemorrhage or infection could  have similar pattern, no mediastinal lymphadenopathy, no mass in the airway or lung   CULTURES: Blood 3/16 >> Sputum 3/16 >>  U/A 3/16 >>   ANTIBIOTICS: Ceftriaxone 3/12 >3/15 Clindamycin 3/12 >3/15 Vancomycin 3/15 >>   Cefepime 3/15 >> Azithromycin 3/15 >>   SIGNIFICANT EVENTS: 3/12 > Presented to ED, Intubated  3/14 > Extubated  3/15 > Re-intubated  3/15 > Transferred to [facility]   LINES/TUBES: ETT 3/12 > 3/14 ETT 3/14 >>   DISCUSSION: Presented to ED on 3/12 with one day of fever, sore throat, dyspnea. In ED Tmax 102, rapid strep positive, flu negative. CT neck with no peritonsillar abscess, however, patient stridorous. Intubated. Placed on ceftriaxone/clindamycin and dexamethasone. Extubated after 36 hours, however later that evening became hypoxic and tachypneic requiring re-intubation. CT chest with dense bilateral airspace disease with air bronchograms.  Awake and follows commands despite sedation.  He has a plateau pressure of 34 despite being on 6 cc/kg  ASSESSMENT / PLAN:  PULMONARY A: Acute Hypoxic Respiratory Failure in setting of Severe ARDS secondary to presumed CAP, +Strep A with noted pharyngitis  PO2/Fi02 = 100  H/O Tobacco Abuse  CT scan consistent with ARDS P:   Bundle goal 6 cc/kg Antibiotics Diuresis as tolerated  CARDIOVASCULAR A:  H/O DVT - Wells Score 9.0  P:  Cardiac monitor Maintain  adequate blood pressure No PE by CTA    RENAL Lab Results  Component Value Date   CREATININE 1.19 04/26/2017   Recent Labs  Lab 04/26/17 0039  K 3.2*    A:   Hypokalemia  P:   Follow electrolytes Replete potassium  GASTROINTESTINAL A:   SUP P:   N.p.o. PPI Start tube feeding  HEMATOLOGIC Recent Labs    04/26/17 0039  HGB 15.4    A:   DVT Prophylaxis  P:  Follow CBC Subcu heparin  INFECTIOUS A:   Rapid Strep A positive (OSH believed this was a colonization)  Febrile, Leukocytosis  P:   Trend WBC and Fever  Lactic acid  1.2 Pro-calcitonin 0.48 Pan culture RVP negative Antibiotics of Zosyn and vancomycin started on 04/26/2017  ENDOCRINE CBG (last 3)  Recent Labs    04/26/17 0406 04/26/17 0831 04/26/17 1215  GLUCAP 131* 119* 113*    A:   No issues    P:   Plan scale insulin  NEUROLOGIC A:   Sedation Needs  P:   Interventions sedation scale goal of 0 to -1 04/26/2017 awake and follows commands   FAMILY  - Updates: 04/26/2017 wife updated at bedside  - Inter-disciplinary family meet or Palliative Care meeting due by:  3/23   NP critical care time 30 minutes   Richardson Landry Minor ACNP Maryanna Shape PCCM Pager [phone] till 1 pm If no answer page [phone]- [phone] 04/26/2017, 2:09 PM   Attending Note:  I have examined patient, reviewed labs, studies and notes. I have discussed the case with S Minor, and I agree with the data and plans as amended above.  42 year old man history of tobacco use and remote DVT.  Admitted with sore throat and suspected strep pharyngitis, stridor.  Noted to have evolving respiratory failure and then subsequently dense airspace disease.  Question pneumonia versus acute lung injury, question negative pressure pulmonary edema in the setting of upper airway obstruction.  He is intubated and ventilated.  Started on vancomycin, azithromycin, cefepime to cover possible severe pneumonia. Vitals:   04/26/17 1200 04/26/17 1300 04/26/17 1400 04/26/17 1500  BP:      Pulse: 82 81 85 84  Resp: (!) 33 (!) 28 (!) 28 (!) 31  Temp: (!) 100.9 F (38.3 C) (!) 100.9 F (38.3 C) (!) 101.1 F (38.4 C) (!) 101.3 F (38.5 C)  TempSrc:      SpO2: 91% 90% 93% 93%  Weight:      Height:      On evaluation he is a somewhat overweight man ventilated and in no distress.  Endotracheal tube is in good position pupils are equal and reactive.  He is sedated but will open eyes, follow commands and move all extremities.  Lungs are coarse bilaterally with some faint bilateral expiratory wheezes.  Heart  regular without a murmur, abdomen soft nontender with positive bowel sounds.  He has 1+ lower extremity edema.  We will continue current ventilator strategy with low tidal volume ventilation, 6 cc/kg.  Wean PEEP and FiO2 as able.  Empiric diuretics today to drive down CVP.  Continue current antibiotics and adjust based on culture data.   Independent critical care time is 33 minutes.   Baltazar Apo, MD, PhD 04/26/2017, 4:00 PM [facility] [phone] or if no answer [phone]

## 2017-04-26 NOTE — Progress Notes (Signed)
Initial Nutrition Assessment  DOCUMENTATION CODES:  Obesity unspecified  INTERVENTION:  Initiate TF via OGT with Vital High Protein at goal rate of 65 ml/h (1560 ml per day) and Prostat 30 ml Q24 to provide 1660 kcals, 152 gm protein, 1304 ml free water daily.  NUTRITION DIAGNOSIS:  Inadequate oral intake related to inability to eat as evidenced by NPO status.  GOAL:  Provide needs based on ASPEN/SCCM guidelines  MONITOR:  Vent status, Labs, Weight trends, Diet advancement, TF tolerance  REASON FOR ASSESSMENT:  Ventilator    ASSESSMENT:  42 y/o obese male. Presented to OSH 3/12 w/ 1 day fever, sore throat, dyspnea. Had Tmax 102. Intubated, but successfully extubated after 36 hrs. However, there after developed hypoxia, tachypnea and required reintubation. Tx to [facility]. Pt w/ severe ARDS 2/2 presumed CAP +Strep A.   Family present on RD arrival. Also, Patient is alert and well oriented on ventilator. Richard Dyer states that Richard Dyer did not received TF while intubated at [facility], however Richard Dyer did have 1 meal after Richard Dyer was extubated, prior to decompensating again.   Richard Dyer reports a UBW of 280. Richard Dyer does not know if Richard Dyer has lost or gained any weight. Very limited weight history available in chart. Per Care Everywhere, Richard Dyer was weighed at 276 lbs 2 years ago, nearly identical to admit weight  Richard Dyer does not know when his last BM was, but does not feel constipated  Physical Exam: WDL. Morbidly obese.   Orders received to start TF  Patient is currently intubated on ventilator support MV: 15.4 L/min Temp (24hrs), Avg:100.6 F (38.1 C), Min:99.9 F (37.7 C), Max:101.1 F (38.4 C) Propofol: None  Labs: BGs 115-130, K: 3.2, Albumin: 3.1 Meds: PPI Infusions: Iv abx, IVF,  Sedation/Analgesia:  Fentanyl   Recent Labs  Lab 04/26/17 0039  NA 137  K 3.2*  CL 97*  CO2 25  BUN 15  CREATININE 1.19  CALCIUM 7.9*  MG 2.2  PHOS 3.2  GLUCOSE 116*   NUTRITION - FOCUSED PHYSICAL EXAM: WDL.   Diet Order:  No  diet orders on file  EDUCATION NEEDS:  No education needs have been identified at this time  Skin:  Skin Assessment: Reviewed RN Assessment  Last BM:  Unknown  Height:  Ht Readings from Last 1 Encounters:  04/25/17 5\' 10"  (1.778 m)   Weight:  Wt Readings from Last 1 Encounters:  04/26/17 275 lb 9.2 oz (125 kg)   Ideal Body Weight:  75.45 kg  BMI:  Body mass index is 39.54 kg/m.  Estimated Nutritional Needs:  Kcal:  1375-1750 kcals (11-14 kcal/kg bw) Protein:  >151 g Pro (2g/kg ibw) Fluid:  Per MD goals  Burtis Junes RD, LDN, CNSC Clinical Nutrition Pager: [phone] 04/26/2017 11:57 AM

## 2017-04-26 NOTE — Progress Notes (Signed)
Pt's temp 102.2 and pt uncomfortable and red faced. Dr. Lamonte Sakai made aware. New orders carried out.

## 2017-04-26 NOTE — Progress Notes (Signed)
VASCULAR LAB PRELIMINARY  PRELIMINARY  PRELIMINARY  PRELIMINARY  Bilateral lower extremity venous duplex completed.    Preliminary report:  There is no DVT or SVT noted in the bilateral lower extremities.   Gwendloyn Forsee, RVT 04/26/2017, 6:39 PM

## 2017-04-27 ENCOUNTER — Inpatient Hospital Stay (HOSPITAL_COMMUNITY): Payer: 59

## 2017-04-27 LAB — POCT I-STAT 3, ART BLOOD GAS (G3+)
ACID-BASE EXCESS: 4 mmol/L — AB (ref 0.0–2.0)
BICARBONATE: 33.7 mmol/L — AB (ref 20.0–28.0)
O2 Saturation: 94 %
TCO2: 36 mmol/L — ABNORMAL HIGH (ref 22–32)
pCO2 arterial: 78 mmHg (ref 32.0–48.0)
pH, Arterial: 7.249 — ABNORMAL LOW (ref 7.350–7.450)
pO2, Arterial: 91 mmHg (ref 83.0–108.0)

## 2017-04-27 LAB — PHOSPHORUS: PHOSPHORUS: 2.7 mg/dL (ref 2.5–4.6)

## 2017-04-27 LAB — CBC WITH DIFFERENTIAL/PLATELET
BASOS ABS: 0 10*3/uL (ref 0.0–0.1)
Basophils Relative: 0 %
EOS ABS: 0.5 10*3/uL (ref 0.0–0.7)
EOS PCT: 2 %
HCT: 40.8 % (ref 39.0–52.0)
Hemoglobin: 14 g/dL (ref 13.0–17.0)
LYMPHS PCT: 6 %
Lymphs Abs: 1.4 10*3/uL (ref 0.7–4.0)
MCH: 30.5 pg (ref 26.0–34.0)
MCHC: 34.3 g/dL (ref 30.0–36.0)
MCV: 88.9 fL (ref 78.0–100.0)
Monocytes Absolute: 1.9 10*3/uL — ABNORMAL HIGH (ref 0.1–1.0)
Monocytes Relative: 8 %
Neutro Abs: 19 10*3/uL — ABNORMAL HIGH (ref 1.7–7.7)
Neutrophils Relative %: 84 %
PLATELETS: 253 10*3/uL (ref 150–400)
RBC: 4.59 MIL/uL (ref 4.22–5.81)
RDW: 13.3 % (ref 11.5–15.5)
WBC: 22.7 10*3/uL — AB (ref 4.0–10.5)

## 2017-04-27 LAB — BLOOD GAS, ARTERIAL
ACID-BASE EXCESS: 5.1 mmol/L — AB (ref 0.0–2.0)
ACID-BASE EXCESS: 5 mmol/L — AB (ref 0.0–2.0)
BICARBONATE: 32 mmol/L — AB (ref 20.0–28.0)
Bicarbonate: 30.3 mmol/L — ABNORMAL HIGH (ref 20.0–28.0)
Drawn by: 51702
Drawn by: 441371
FIO2: 95
FIO2: 90
LHR: 18 {breaths}/min
LHR: 20 {breaths}/min
MECHVT: 440 mL
O2 SAT: 92 %
O2 SAT: 92.7 %
PATIENT TEMPERATURE: 100.8
PCO2 ART: 57.5 mmHg — AB (ref 32.0–48.0)
PCO2 ART: 82.8 mmHg — AB (ref 32.0–48.0)
PEEP/CPAP: 15 cmH2O
PEEP: 13 cmH2O
PH ART: 7.347 — AB (ref 7.350–7.450)
PH ART: 7.22 — AB (ref 7.350–7.450)
PO2 ART: 81.3 mmHg — AB (ref 83.0–108.0)
Patient temperature: 100.4
VT: 440 mL
pO2, Arterial: 70.9 mmHg — ABNORMAL LOW (ref 83.0–108.0)

## 2017-04-27 LAB — GLUCOSE, CAPILLARY
GLUCOSE-CAPILLARY: 100 mg/dL — AB (ref 65–99)
GLUCOSE-CAPILLARY: 156 mg/dL — AB (ref 65–99)
GLUCOSE-CAPILLARY: 98 mg/dL (ref 65–99)
Glucose-Capillary: 103 mg/dL — ABNORMAL HIGH (ref 65–99)
Glucose-Capillary: 109 mg/dL — ABNORMAL HIGH (ref 65–99)
Glucose-Capillary: 118 mg/dL — ABNORMAL HIGH (ref 65–99)
Glucose-Capillary: 168 mg/dL — ABNORMAL HIGH (ref 65–99)

## 2017-04-27 LAB — BASIC METABOLIC PANEL
ANION GAP: 8 (ref 5–15)
BUN: 20 mg/dL (ref 6–20)
CHLORIDE: 103 mmol/L (ref 101–111)
CO2: 28 mmol/L (ref 22–32)
Calcium: 8.3 mg/dL — ABNORMAL LOW (ref 8.9–10.3)
Creatinine, Ser: 0.92 mg/dL (ref 0.61–1.24)
GFR calc non Af Amer: >60 mL/min (ref >60)
Glucose, Bld: 158 mg/dL — ABNORMAL HIGH (ref 65–99)
POTASSIUM: 3.8 mmol/L (ref 3.5–5.1)
Sodium: 139 mmol/L (ref 135–145)

## 2017-04-27 LAB — PROCALCITONIN: Procalcitonin: 0.47 ng/mL

## 2017-04-27 LAB — MAGNESIUM: Magnesium: 2.4 mg/dL (ref 1.7–2.4)

## 2017-04-27 MED ORDER — MIDAZOLAM BOLUS VIA INFUSION: 2.0000 mg | INTRAVENOUS | Status: DC | PRN

## 2017-04-27 MED ORDER — FENTANYL BOLUS VIA INFUSION: 50.0000 ug | INTRAVENOUS | Status: DC | PRN

## 2017-04-27 MED ORDER — IPRATROPIUM-ALBUTEROL 0.5-2.5 (3) MG/3ML IN SOLN
3.0000 mL | Freq: Four times a day (QID) | RESPIRATORY_TRACT | Status: DC
  Administered 2017-04-27 – 2017-05-04 (×28): 3 mL via RESPIRATORY_TRACT
  Filled 2017-04-27 (×27): qty 3

## 2017-04-27 MED ORDER — IPRATROPIUM-ALBUTEROL 0.5-2.5 (3) MG/3ML IN SOLN: 3.0000 mL | Freq: Four times a day (QID) | RESPIRATORY_TRACT | Status: DC

## 2017-04-27 MED ORDER — CISATRACURIUM BOLUS VIA INFUSION
0.1000 mg/kg | Freq: Once | INTRAVENOUS | Status: AC
  Administered 2017-04-27: 12.5 mg via INTRAVENOUS
  Filled 2017-04-27: qty 13

## 2017-04-27 MED ORDER — ARTIFICIAL TEARS OPHTHALMIC OINT
1.0000 "application " | TOPICAL_OINTMENT | Freq: Three times a day (TID) | OPHTHALMIC | Status: DC
  Administered 2017-04-27 – 2017-04-29 (×7): 1 via OPHTHALMIC
  Filled 2017-04-27: qty 3.5

## 2017-04-27 MED ORDER — MIDAZOLAM HCL 2 MG/2ML IJ SOLN: 2.0000 mg | Freq: Once | INTRAMUSCULAR | Status: DC

## 2017-04-27 MED ORDER — SODIUM CHLORIDE 0.9 % IV SOLN
1.0000 mg/h | INTRAVENOUS | Status: DC
  Administered 2017-04-27 (×2): 10 mg/h via INTRAVENOUS
  Administered 2017-04-27: 1 mg/h via INTRAVENOUS
  Administered 2017-04-28 – 2017-04-29 (×6): 10 mg/h via INTRAVENOUS
  Filled 2017-04-27 (×11): qty 5

## 2017-04-27 MED ORDER — FENTANYL CITRATE (PF) 100 MCG/2ML IJ SOLN: 100.0000 ug | Freq: Once | INTRAMUSCULAR | Status: DC

## 2017-04-27 MED ORDER — CHLORHEXIDINE GLUCONATE CLOTH 2 % EX PADS
6.0000 | MEDICATED_PAD | Freq: Every day | CUTANEOUS | Status: DC
  Administered 2017-04-27 – 2017-04-28 (×2): 6 via TOPICAL

## 2017-04-27 MED ORDER — MIDAZOLAM HCL 2 MG/2ML IJ SOLN: 2.0000 mg | Freq: Once | INTRAMUSCULAR | Status: DC | PRN

## 2017-04-27 MED ORDER — IPRATROPIUM-ALBUTEROL 0.5-2.5 (3) MG/3ML IN SOLN
3.0000 mL | RESPIRATORY_TRACT | Status: DC
  Administered 2017-04-27: 3 mL via RESPIRATORY_TRACT
  Filled 2017-04-27: qty 3

## 2017-04-27 MED ORDER — SODIUM CHLORIDE 0.9 % IV SOLN
3.0000 ug/kg/min | INTRAVENOUS | Status: DC
  Administered 2017-04-27 (×2): 3 ug/kg/min via INTRAVENOUS
  Administered 2017-04-28: 4 ug/kg/min via INTRAVENOUS
  Administered 2017-04-28 (×2): 3.5 ug/kg/min via INTRAVENOUS
  Administered 2017-04-29: 4 ug/kg/min via INTRAVENOUS
  Filled 2017-04-27 (×7): qty 20

## 2017-04-27 MED ORDER — FENTANYL CITRATE (PF) 100 MCG/2ML IJ SOLN: 100.0000 ug | Freq: Once | INTRAMUSCULAR | Status: DC | PRN

## 2017-04-27 MED ORDER — MIDAZOLAM HCL 50 MG/10ML IJ SOLN
0.5000 mg/h | INTRAMUSCULAR | Status: DC
  Administered 2017-04-27: 0.5 mg/h via INTRAVENOUS
  Administered 2017-04-27 – 2017-04-29 (×8): 10 mg/h via INTRAVENOUS
  Filled 2017-04-27 (×11): qty 10

## 2017-04-27 NOTE — Progress Notes (Addendum)
PULMONARY / CRITICAL CARE MEDICINE   Name: Richard Dyer MRN: [id number] DOB: 07/11/1975    ADMISSION DATE:  04/25/2017 CONSULTATION DATE:  04/25/2017  REFERRING MD:  [facility]   CHIEF COMPLAINT:  Hypoxic Respiratory Failure   HISTORY OF PRESENT ILLNESS:   42 year old male current smoker with PMH of remote DVT.   Presented to ED on 3/12 with one day of fever, sore throat, dyspnea. In ED Tmax 102, rapid strep positive, flu negative. Family noted patient has had recent exposure to flu. CT neck with no peritonsillar abscess, however, patient stridorous. Intubated. Placed on ceftriaxone/clindamycin and dexamethasone. Extubated after 36 hours, however later that evening became hypoxic and tachypneic requiring re-intubation. CT chest with dense bilateral airspace disease with air bronchograms. Transferred to [facility] for further work-up.      SUBJECTIVE:  Obese male currently on low stretch and not tolerating well secondary to being wide awake.   VITAL SIGNS: BP (!) 150/81   Pulse (!) 112   Temp (!) 101.5 F (38.6 C) Comment (Src): Tylenol given  Resp (!) 26   Ht 5\' 10"  (1.778 m)   Wt 124.8 kg (275 lb 2.2 oz)   SpO2 92%   BMI 39.48 kg/m   HEMODYNAMICS:    VENTILATOR SETTINGS: Vent Mode: PRVC FiO2 (%):  [60 %-100 %] 90 % Set Rate:  [18 bmp] 18 bmp Vt Set:  [440 mL] 440 mL PEEP:  [10 cmH20-13 cmH20] 13 cmH20 Plateau Pressure:  [17 cmH20-19 cmH20] 17 cmH20  INTAKE / OUTPUT: I/O last 3 completed shifts: In: 4152.3 [I.V.:765.1; NG/GT:1212.2; IV HERDEYCXK:4818] Out: 2220 [Urine:2220]  PHYSICAL EXAMINATION: General: Obese male who is wide awake despite being on 400 mcg of fentanyl IV. HEENT: Endotracheal tube to vent orogastric tube to suction PSY: Appropriate Neuro: Moves all extremities follows commands malls were CV: Heart sounds are regular in rate and rhythm PULM: Coarse rhonchi bilaterally HU:DJSH, non-tender, bsx4 active  Extremities: warm/dry, 1+ edema   Skin: Red face but no obvious rash.    LABS:  BMET Recent Labs  Lab 04/26/17 0039 04/27/17 0404  NA 137 139  K 3.2* 3.8  CL 97* 103  CO2 25 28  BUN 15 20  CREATININE 1.19 0.92  GLUCOSE 116* 158*    Electrolytes Recent Labs  Lab 04/26/17 0039 04/27/17 0404  CALCIUM 7.9* 8.3*  MG 2.2 2.4  PHOS 3.2 2.7    CBC Recent Labs  Lab 04/26/17 0039 04/27/17 0404  WBC 15.7* 22.7*  HGB 15.4 14.0  HCT 43.3 40.8  PLT 246 253    Coag's No results for input(s): APTT, INR in the last 168 hours.  Sepsis Markers Recent Labs  Lab 04/26/17 0037 04/27/17 0404  LATICACIDVEN 1.2  --   PROCALCITON 0.48 0.47    ABG Recent Labs  Lab 04/26/17 0437 04/26/17 2110 04/27/17 0428  PHART 7.417 7.384 7.347*  PCO2ART 46.7 51.9* 57.5*  PO2ART 61.0* 85.8 70.9*    Liver Enzymes Recent Labs  Lab 04/26/17 0037  AST 19  ALT 19  ALKPHOS 53  BILITOT 1.5*  ALBUMIN 3.1*    Cardiac Enzymes Recent Labs  Lab 04/26/17 0037  TROPONINI <0.03    Glucose Recent Labs  Lab 04/26/17 1215 04/26/17 1630 04/26/17 1942 04/27/17 0038 04/27/17 0346 04/27/17 0840  GLUCAP 113* 130* 145* 118* 156* 168*    Imaging Dg Chest Port 1 View  Result Date: 04/27/2017 CLINICAL DATA:  Patient with respiratory failure. EXAM: PORTABLE CHEST 1 VIEW COMPARISON:  Chest CT and chest radiograph 04/26/2017. FINDINGS: ET tube terminates in the mid trachea. Enteric tube courses inferior to the diaphragm. Stable cardiomegaly. Slight interval increase in right-greater-than-left airspace opacities. No pleural effusion or pneumothorax. IMPRESSION: Slight interval increase in diffuse right-greater-than-left airspace opacities. Considerations include multifocal pneumonia or edema. ET tube mid trachea. Electronically Signed   By: Lovey Newcomer M.D.   On: 04/27/2017 07:44     STUDIES:  CT Chest 3/15 > Dense Confluent Bilateral airspace disease with air bronchograms is suggestive of severe pulmonary edema/ARDS.  Diffuse pulmonary hemorrhage or infection could have similar pattern, no mediastinal lymphadenopathy, no mass in the airway or lung   CULTURES: Blood 3/16 >> Sputum 3/16 >>  U/A 3/16 >>   ANTIBIOTICS: Ceftriaxone 3/12 >3/15 Clindamycin 3/12 >3/15 Vancomycin 3/15 >> 04/28/2007 Cefepime 3/15 >> 316 Azithromycin 3/15 >> 316 Zosyn 04/26/2017>>  SIGNIFICANT EVENTS: 3/12 > Presented to ED, Intubated  3/14 > Extubated  3/15 > Re-intubated  3/15 > Transferred to [facility]   LINES/TUBES: ETT 3/12 > 3/14 ETT 3/14 >>   DISCUSSION: 42 year old male who originally presented on 312 with fever sore throat and progressed to acute respiratory failure now ARDS.  He is on low stretch protocol and is requiring heavy sedation to maintain synchrony with the vent.  He may need neuromuscular blockade to achieve total relaxation.  He has a listed vancomycin allergy which is a rash.  He has no overt rash but he is red face and has been on vancomycin, positive culture data he has MRSA that is negative therefore comfortable in taking him off vancomycin for now we will continue to monitor emergency Zosyn  ASSESSMENT / PLAN:  PULMONARY A: Acute Hypoxic Respiratory Failure in setting of Severe ARDS secondary to presumed CAP, +Strep A with noted pharyngitis  PO2/Fi02 = 100  H/O Tobacco Abuse  CT scan consistent with ARDS P:   Difficult tosedate individual which is leading to increased plateau pressures as he is asynchronous with the vent.  04/27/2017 he was switched to Dilaudid and Versed as the proband and fentanyl have failed.  His family was encouraged to decrease the amount of stimulation as he continues is receiving any acute family members in room to 1. If he fails this amount of sedation he will most likely require neuromuscular blockade. Plateau pressures are noted to peak at 40 prior to sedation 12 noon 04/27/2017 despite adequate sedation he continues to pressure on the ventilator plateau pressures  are greater than 30 we will start neuromuscular blockade to facilitate adequate ventilation.  CARDIOVASCULAR A:  H/O DVT - Wells Score 9.0  P:  Cardiac monitor No PE by CT  RENAL Lab Results  Component Value Date   CREATININE 0.92 04/27/2017   CREATININE 1.19 04/26/2017   Recent Labs  Lab 04/26/17 0039 04/27/17 0404  K 3.2* 3.8    A:   Hypokalemia  P:   Following replace electrolytes as needed   GASTROINTESTINAL A:   SUP P:   N.p.o. Proton pump inhibitor Tube feedings  HEMATOLOGIC Recent Labs    04/26/17 0039 04/27/17 0404  HGB 15.4 14.0    A:   DVT Prophylaxis  P:  Follow CBC Subcu heparin  INFECTIOUS A:   Rapid Strep A positive (OSH believed this was a colonization)  Febrile, Leukocytosis  P:   Empiric antibiotics of vancomycin and Zosyn were started on 04/26/2017 He carries an allergy to vancomycin which is rash.  He is noted to be red faced on  04/27/2017 without any overt rash.  04/27/2017 will discontinue vancomycin this time and continue to monitor on just Zosyn. Continue to monitor culture data Cooling blanket for hyperthermia  ENDOCRINE CBG (last 3)  Recent Labs    04/27/17 0038 04/27/17 0346 04/27/17 0840  GLUCAP 118* 156* 168*    A:   No issues    P:   Sliding scale insulin  NEUROLOGIC A:   Sedation Needs  P:   He is proven refractory to fentanyl in the proband. 04/27/2017 placed on Dilaudid and Versed drips   FAMILY  - Updates: 03/30/2017 wife and other family was updated at bedside.  We will need to decrease outside stimulation so he can be easily sedated.  - Inter-disciplinary family meet or Palliative Care meeting due by:  3/23   NP critical care time 45 minutes   Richardson Landry Akeiba Axelson ACNP [facility] PCCM Pager [phone] till 1 pm If no answer page [phone] 04/27/2017, 9:47 AM

## 2017-04-27 NOTE — Progress Notes (Signed)
Critical ABG values given to Dr. Lamonte Sakai at 13:35.  RR changed to 26 per MD request.

## 2017-04-27 NOTE — Progress Notes (Signed)
Fentanyl 175 ml was wasted into the sink. Witnessed by Darden Amber, RN and Mayford Knife, RN

## 2017-04-27 NOTE — Procedures (Signed)
Central Venous Catheter Insertion Procedure Note Richard Dyer [id number] 1975/10/19  Procedure: Insertion of Central Venous Catheter Indications: Assessment of intravascular volume, Drug and/or fluid administration and Frequent blood sampling  Procedure Details Consent: Risks of procedure as well as the alternatives and risks of each were explained to the (patient/caregiver).  Consent for procedure obtained. Time Out: Verified patient identification, verified procedure, site/side was marked, verified correct patient position, special equipment/implants available, medications/allergies/relevent history reviewed, required imaging and test results available.  Performed  Maximum sterile technique was used including antiseptics, cap, gloves, gown, hand hygiene, mask and sheet. Skin prep: Chlorhexidine; local anesthetic administered A antimicrobial bonded/coated triple lumen catheter was placed in the left internal jugular vein using the Seldinger technique. Ultrasound guidance used.Yes.   Catheter placed to 20 cm. Blood aspirated via all 3 ports and then flushed x 3. Line sutured x 2 and dressing applied.  Evaluation Blood flow good Complications: No apparent complications Patient did tolerate procedure well. Chest X-ray ordered to verify placement.  CXR: pending.  Richardson Landry Minor ACNP [facility] PCCM Pager [phone] till 1 pm If no answer page [phone] 04/27/2017, 12:54 PM

## 2017-04-27 NOTE — Plan of Care (Signed)
  Progressing Health Behavior/Discharge Planning: Ability to manage health-related needs will improve 04/27/2017 2227 - Progressing by Earnie Larsson, RN Clinical Measurements: Ability to maintain clinical measurements within normal limits will improve 04/27/2017 2227 - Progressing by Earnie Larsson, RN Will remain free from infection 04/27/2017 2227 - Progressing by Earnie Larsson, RN Diagnostic test results will improve 04/27/2017 2227 - Progressing by Earnie Larsson, RN Respiratory complications will improve 04/27/2017 2227 - Progressing by Earnie Larsson, RN Cardiovascular complication will be avoided 04/27/2017 2227 - Progressing by Earnie Larsson, RN Activity: Risk for activity intolerance will decrease 04/27/2017 2227 - Progressing by Earnie Larsson, RN Nutrition: Adequate nutrition will be maintained 04/27/2017 2227 - Progressing by Earnie Larsson, RN Coping: Level of anxiety will decrease 04/27/2017 2227 - Progressing by Earnie Larsson, RN Elimination: Will not experience complications related to bowel motility 04/27/2017 2227 - Progressing by Earnie Larsson, RN Will not experience complications related to urinary retention 04/27/2017 2227 - Progressing by Earnie Larsson, RN Pain Managment: General experience of comfort will improve 04/27/2017 2227 - Progressing by Earnie Larsson, RN Safety: Ability to remain free from injury will improve 04/27/2017 2227 - Progressing by Earnie Larsson, RN Skin Integrity: Risk for impaired skin integrity will decrease 04/27/2017 2227 - Progressing by Earnie Larsson, RN

## 2017-04-28 ENCOUNTER — Inpatient Hospital Stay (HOSPITAL_COMMUNITY): Payer: 59

## 2017-04-28 DIAGNOSIS — J9601 Acute respiratory failure with hypoxia: Secondary | ICD-10-CM

## 2017-04-28 LAB — PROCALCITONIN: PROCALCITONIN: 0.66 ng/mL

## 2017-04-28 LAB — BASIC METABOLIC PANEL
ANION GAP: 10 (ref 5–15)
BUN: 25 mg/dL — AB (ref 6–20)
CO2: 27 mmol/L (ref 22–32)
Calcium: 8.4 mg/dL — ABNORMAL LOW (ref 8.9–10.3)
Chloride: 103 mmol/L (ref 101–111)
Creatinine, Ser: 0.92 mg/dL (ref 0.61–1.24)
GFR calc Af Amer: >60 mL/min (ref >60)
GFR calc non Af Amer: >60 mL/min (ref >60)
GLUCOSE: 136 mg/dL — AB (ref 65–99)
POTASSIUM: 3.3 mmol/L — AB (ref 3.5–5.1)
Sodium: 140 mmol/L (ref 135–145)

## 2017-04-28 LAB — CULTURE, RESPIRATORY W GRAM STAIN

## 2017-04-28 LAB — CBC WITH DIFFERENTIAL/PLATELET
BASOS ABS: 0 10*3/uL (ref 0.0–0.1)
Basophils Relative: 0 %
EOS PCT: 4 %
Eosinophils Absolute: 0.6 10*3/uL (ref 0.0–0.7)
HEMATOCRIT: 36.7 % — AB (ref 39.0–52.0)
Hemoglobin: 12.4 g/dL — ABNORMAL LOW (ref 13.0–17.0)
LYMPHS PCT: 12 %
Lymphs Abs: 1.7 10*3/uL (ref 0.7–4.0)
MCH: 30.8 pg (ref 26.0–34.0)
MCHC: 33.8 g/dL (ref 30.0–36.0)
MCV: 91.1 fL (ref 78.0–100.0)
MONO ABS: 0.9 10*3/uL (ref 0.1–1.0)
Monocytes Relative: 6 %
NEUTROS ABS: 11.1 10*3/uL — AB (ref 1.7–7.7)
Neutrophils Relative %: 78 %
Platelets: 248 10*3/uL (ref 150–400)
RBC: 4.03 MIL/uL — AB (ref 4.22–5.81)
RDW: 13.8 % (ref 11.5–15.5)
WBC: 14.4 10*3/uL — ABNORMAL HIGH (ref 4.0–10.5)

## 2017-04-28 LAB — CULTURE, RESPIRATORY: CULTURE: NORMAL

## 2017-04-28 LAB — GLUCOSE, CAPILLARY
GLUCOSE-CAPILLARY: 122 mg/dL — AB (ref 65–99)
GLUCOSE-CAPILLARY: 125 mg/dL — AB (ref 65–99)
Glucose-Capillary: 111 mg/dL — ABNORMAL HIGH (ref 65–99)
Glucose-Capillary: 100 mg/dL — ABNORMAL HIGH (ref 65–99)
Glucose-Capillary: 125 mg/dL — ABNORMAL HIGH (ref 65–99)
Glucose-Capillary: 107 mg/dL — ABNORMAL HIGH (ref 65–99)

## 2017-04-28 LAB — POCT I-STAT 3, ART BLOOD GAS (G3+)
ACID-BASE EXCESS: 6 mmol/L — AB (ref 0.0–2.0)
BICARBONATE: 31.6 mmol/L — AB (ref 20.0–28.0)
O2 Saturation: 88 %
PH ART: 7.421 (ref 7.350–7.450)
TCO2: 33 mmol/L — ABNORMAL HIGH (ref 22–32)
pCO2 arterial: 48.9 mmHg — ABNORMAL HIGH (ref 32.0–48.0)
pO2, Arterial: 56 mmHg — ABNORMAL LOW (ref 83.0–108.0)

## 2017-04-28 LAB — LEGIONELLA PNEUMOPHILA SEROGP 1 UR AG: L. pneumophila Serogp 1 Ur Ag: NEGATIVE

## 2017-04-28 LAB — MAGNESIUM: Magnesium: 2.6 mg/dL — ABNORMAL HIGH (ref 1.7–2.4)

## 2017-04-28 LAB — PHOSPHORUS: PHOSPHORUS: 2.7 mg/dL (ref 2.5–4.6)

## 2017-04-28 MED ORDER — PANTOPRAZOLE SODIUM 40 MG PO PACK
40.0000 mg | PACK | Freq: Every day | ORAL | Status: DC
  Administered 2017-04-28 – 2017-05-01 (×4): 40 mg
  Filled 2017-04-28 (×4): qty 20

## 2017-04-28 MED ORDER — FLEET ENEMA 7-19 GM/118ML RE ENEM
1.0000 | ENEMA | Freq: Every day | RECTAL | Status: DC | PRN
  Filled 2017-04-28: qty 1

## 2017-04-28 MED ORDER — SODIUM CHLORIDE 0.9% FLUSH
10.0000 mL | Freq: Two times a day (BID) | INTRAVENOUS | Status: DC
  Administered 2017-04-28 – 2017-04-29 (×3): 10 mL
  Administered 2017-04-30: 20 mL
  Administered 2017-05-01: 10 mL
  Administered 2017-05-01: 20 mL
  Administered 2017-05-02: 10 mL
  Administered 2017-05-02: 20 mL
  Administered 2017-05-03 – 2017-05-04 (×4): 10 mL

## 2017-04-28 MED ORDER — POTASSIUM CHLORIDE 20 MEQ/15ML (10%) PO SOLN
40.0000 meq | ORAL | Status: AC
  Administered 2017-04-28 (×3): 40 meq
  Filled 2017-04-28 (×4): qty 30

## 2017-04-28 MED ORDER — SENNOSIDES 8.8 MG/5ML PO SYRP
15.0000 mL | ORAL_SOLUTION | Freq: Two times a day (BID) | ORAL | Status: DC
  Administered 2017-04-28 – 2017-05-01 (×7): 15 mL
  Filled 2017-04-28 (×8): qty 15

## 2017-04-28 MED ORDER — MAGNESIUM HYDROXIDE 400 MG/5ML PO SUSP
30.0000 mL | Freq: Every day | ORAL | Status: DC | PRN
  Administered 2017-04-28 – 2017-04-29 (×2): 30 mL
  Filled 2017-04-28 (×2): qty 30

## 2017-04-28 MED ORDER — CHLORHEXIDINE GLUCONATE CLOTH 2 % EX PADS
6.0000 | MEDICATED_PAD | Freq: Every day | CUTANEOUS | Status: DC
  Administered 2017-04-29 – 2017-05-04 (×6): 6 via TOPICAL

## 2017-04-28 MED ORDER — BISACODYL 10 MG RE SUPP
10.0000 mg | Freq: Every day | RECTAL | Status: DC | PRN
  Administered 2017-04-28: 10 mg via RECTAL
  Filled 2017-04-28: qty 1

## 2017-04-28 MED ORDER — SODIUM CHLORIDE 0.9% FLUSH: 10.0000 mL | INTRAVENOUS | Status: DC | PRN

## 2017-04-28 MED ORDER — FUROSEMIDE 10 MG/ML IJ SOLN
40.0000 mg | Freq: Two times a day (BID) | INTRAMUSCULAR | Status: AC
  Administered 2017-04-28 (×2): 40 mg via INTRAVENOUS
  Filled 2017-04-28 (×2): qty 4

## 2017-04-28 NOTE — Plan of Care (Signed)
  Progressing Health Behavior/Discharge Planning: Ability to manage health-related needs will improve 04/28/2017 2054 - Progressing by Earnie Larsson, RN Clinical Measurements: Ability to maintain clinical measurements within normal limits will improve 04/28/2017 2054 - Progressing by Earnie Larsson, RN Will remain free from infection 04/28/2017 2054 - Progressing by Earnie Larsson, RN Diagnostic test results will improve 04/28/2017 2054 - Progressing by Earnie Larsson, RN Respiratory complications will improve 04/28/2017 2054 - Progressing by Earnie Larsson, RN Cardiovascular complication will be avoided 04/28/2017 2054 - Progressing by Earnie Larsson, RN Activity: Risk for activity intolerance will decrease 04/28/2017 2054 - Progressing by Earnie Larsson, RN Nutrition: Adequate nutrition will be maintained 04/28/2017 2054 - Progressing by Earnie Larsson, RN Coping: Level of anxiety will decrease 04/28/2017 2054 - Progressing by Earnie Larsson, RN Elimination: Will not experience complications related to bowel motility 04/28/2017 2054 - Progressing by Earnie Larsson, RN Will not experience complications related to urinary retention 04/28/2017 2054 - Progressing by Earnie Larsson, RN Pain Managment: General experience of comfort will improve 04/28/2017 2054 - Progressing by Earnie Larsson, RN Safety: Ability to remain free from injury will improve 04/28/2017 2054 - Progressing by Earnie Larsson, RN Skin Integrity: Risk for impaired skin integrity will decrease 04/28/2017 2054 - Progressing by Earnie Larsson, RN

## 2017-04-28 NOTE — Progress Notes (Signed)
PULMONARY / CRITICAL CARE MEDICINE   Name: Richard Dyer MRN: [id number] DOB: August 23, 1975    ADMISSION DATE:  04/25/2017 CONSULTATION DATE:  04/25/2017  REFERRING MD:  [facility]   CHIEF COMPLAINT:  Hypoxic Respiratory Failure   HISTORY OF PRESENT ILLNESS:   42 year old male current smoker with PMH of remote DVT.   Presented to ED on 3/12 with one day of fever, sore throat, dyspnea. In ED Tmax 102, rapid strep positive, flu negative. Family noted patient has had recent exposure to flu. CT neck with no peritonsillar abscess, however, patient stridorous. Intubated. Placed on ceftriaxone/clindamycin and dexamethasone. Extubated after 36 hours, however later that evening became hypoxic and tachypneic requiring re-intubation. CT chest with dense bilateral airspace disease with air bronchograms. Transferred to [facility] for further work-up.      SUBJECTIVE:    VITAL SIGNS: BP 111/73 (BP Location: Left Wrist)   Pulse 78   Temp 99.3 F (37.4 C) (Core)   Resp (!) 28   Ht 5\' 10"  (1.778 m)   Wt 280 lb 6.8 oz (127.2 kg)   SpO2 95%   BMI 40.24 kg/m   HEMODYNAMICS: CVP:  [7 mmHg-11 mmHg] 7 mmHg  VENTILATOR SETTINGS: Vent Mode: PRVC FiO2 (%):  [50 %-90 %] 50 % Set Rate:  [20 bmp-28 bmp] 28 bmp Vt Set:  [440 mL] 440 mL PEEP:  [13 cmH20-15 cmH20] 15 cmH20 Plateau Pressure:  [27 cmH20-30 cmH20] 27 cmH20  INTAKE / OUTPUT:  Intake/Output Summary (Last 24 hours) at 04/28/2017 0939 Last data filed at 04/28/2017 0900 Gross per 24 hour  Intake 3261.54 ml  Output 1405 ml  Net 1856.54 ml     PHYSICAL EXAMINATION: General: This is a 42 year old male patient currently heavily sedated and under neuromuscular blockade.  His bis score is less than 60, he appears comfortable. HEENT: Normocephalic atraumatic orally intubated Pulmonary: Diffuse rhonchi, equal chest rise.  Plateau pressure currently at 22 cm water. Cardiac: Regular rate and rhythm Abdomen: Slightly distended,  hypoactive bowel sounds, no organomegaly. Extremities/musculoskeletal: Diffuse anasarca.  Warm, dry, strong pulses. Neuro/psych.  Heavily sedated.    LABS:  BMET Recent Labs  Lab 04/26/17 0039 04/27/17 0404 04/28/17 0404  NA 137 139 140  K 3.2* 3.8 3.3*  CL 97* 103 103  CO2 25 28 27   BUN 15 20 25*  CREATININE 1.19 0.92 0.92  GLUCOSE 116* 158* 136*    Electrolytes Recent Labs  Lab 04/26/17 0039 04/27/17 0404 04/28/17 0404  CALCIUM 7.9* 8.3* 8.4*  MG 2.2 2.4 2.6*  PHOS 3.2 2.7 2.7    CBC Recent Labs  Lab 04/26/17 0039 04/27/17 0404 04/28/17 0404  WBC 15.7* 22.7* 14.4*  HGB 15.4 14.0 12.4*  HCT 43.3 40.8 36.7*  PLT 246 253 248    Coag's No results for input(s): APTT, INR in the last 168 hours.  Sepsis Markers Recent Labs  Lab 04/26/17 0037 04/27/17 0404 04/28/17 0404  LATICACIDVEN 1.2  --   --   PROCALCITON 0.48 0.47 0.66    ABG Recent Labs  Lab 04/27/17 0428 04/27/17 1138 04/27/17 1325  PHART 7.347* 7.249* 7.220*  PCO2ART 57.5* 78.0* 82.8*  PO2ART 70.9* 91.0 81.3*    Liver Enzymes Recent Labs  Lab 04/26/17 0037  AST 19  ALT 19  ALKPHOS 53  BILITOT 1.5*  ALBUMIN 3.1*    Cardiac Enzymes Recent Labs  Lab 04/26/17 0037  TROPONINI <0.03    Glucose Recent Labs  Lab 04/27/17 1254 04/27/17 1707 04/27/17 1931  04/27/17 2346 04/28/17 0337 04/28/17 0717  GLUCAP 109* 103* 100* 98 111* 100*    Imaging Dg Chest Port 1 View  Result Date: 04/28/2017 CLINICAL DATA:  Hypoxia EXAM: PORTABLE CHEST 1 VIEW COMPARISON:  April 27, 2017 FINDINGS: Endotracheal tube tip is 5.0 cm above the carina. Central catheter tip is in the superior vena cava near the cavoatrial junction. Nasogastric tube tip and side port below the diaphragm. No pneumothorax. There is patchy airspace consolidation throughout both mid and lower lung zones with the greatest degree of consolidation in the medial right base. There is felt to be a degree of underlying  interstitial edema. Heart is mildly enlarged with pulmonary venous hypertension. No adenopathy. No bone lesions. IMPRESSION: Tube and catheter positions as described without evident pneumothorax. Areas of airspace opacity bilaterally with consolidation noted in the bases, more on the right than on the left. There is also pulmonary vascular congestion with interstitial edema. Suspect a degree of congestive heart failure. The areas of airspace opacity may represent alveolar edema or pneumonia. Both entities may exist concurrently. The consolidation in the right base does appear more suspicious for pneumonia than alveolar edema. Appearance of the lungs is similar compared to 1 day prior. Electronically Signed   By: Lowella Grip III M.D.   On: 04/28/2017 08:24   Dg Chest Port 1 View  Result Date: 04/27/2017 CLINICAL DATA:  42 year old male central line placement. Respiratory failure, intubated. EXAM: PORTABLE CHEST 1 VIEW COMPARISON:  0455 hours today and earlier. FINDINGS: Portable AP supine view at 1245 hours. Left IJ approach central line placed, tip at the cavoatrial junction level. Stable endotracheal tube tip at the level the clavicles. Enteric tube courses to the abdomen, tip not included. Stable cardiac size and mediastinal contours. Widespread bilateral Patchy and confluent pulmonary opacity persists relatively sparing the left apex and the right lateral lung base. Increased dense retrocardiac opacity now obscuring a portion of the central diaphragm. Otherwise ventilation is stable from this morning. No pneumothorax.  No definite pleural effusion. IMPRESSION: 1. Left IJ central line placed with no adverse features. Tip at the cavoatrial junction level. 2.  Otherwise stable lines and tubes. 3. Increasing medial lower lobe collapse or consolidation. Otherwise stable widespread bilateral pulmonary opacity since this morning. Electronically Signed   By: Genevie Ann M.D.   On: 04/27/2017 13:37     STUDIES:   CT Chest 3/15 > Dense Confluent Bilateral airspace disease with air bronchograms is suggestive of severe pulmonary edema/ARDS. Diffuse pulmonary hemorrhage or infection could have similar pattern, no mediastinal lymphadenopathy, no mass in the airway or lung   CULTURES: Blood 3/16 >> Sputum 3/16 >> normal flora   ANTIBIOTICS: Ceftriaxone 3/12 >3/15 Clindamycin 3/12 >3/15 Vancomycin 3/15 >> 04/28/2007 Cefepime 3/15 >> 316 Azithromycin 3/15 >> 316 Zosyn 04/26/2017>>  SIGNIFICANT EVENTS: 3/12 > Presented to ED, Intubated  3/14 > Extubated  3/15 > Re-intubated  3/15 > Transferred to [facility]   LINES/TUBES: ETT 3/12 > 3/14 ETT 3/14 >>   DISCUSSION: 42 year old male who originally presented on 312 with fever sore throat and progressed to acute respiratory failure now ARDS.  He was placed on neuromuscular blockade on 3/17.  He seems to be improving on several fronts: Improved aeration on chest x-ray, decreased FiO2 and PEEP requirements, saturations stable.  For today plan will be to initiate diuresis, continue to wean PEEP and FiO2, will continue neuromuscular blockade for another 24 hours.  Agitation and ventilator synchrony will be a major concern  following de-escalation of neuromuscular blockade.  Will address this likely with Precedex when that time comes.   ASSESSMENT / PLAN:  Acute Hypoxic Respiratory Failure in setting of Severe ARDS secondary to presumed CAP, +Strep A with noted pharyngitis  H/O Tobacco Abuse  -CT scan consistent with ARDS -placed on NMB 3/17 -PCXR: Endotracheal tube as well as left internal jugular vein catheter are in satisfactory position.  Comparing serial chest x-ray continues to have bilateral pulmonary infiltrates however aeration has improved when compared to film on 3/17 -Vancomycin was discontinued on  3/17 given concern for face being red, as well as prior history of allergy -Most recent sputum culture demonstrating normal flora on 3/16.   Procalcitonin not impressive. Plan Continue ARDS protocol Continue another 24 hours of neuromuscular blockade, heavy sedation continued to facilitate this Continue to wean PEEP and FiO2 per protocol Repeat ABG Add IV Lasix Day #4 Zosyn, today is day 7 total; tentatively planning for 10-day total course which would give him 7 days of Zosyn: Stop date has been placed  Anemia of critical illness -No evidence of bleeding Plan Trend cbc Transfuse per protocol (Hgb < 7)  H/o DVT; CT chest neg  Plan Cont [place] heparin   Fluid and electrolyte imbalance: hypokalemia  Plan Replace K  DVT prophylaxis: [place] and PAS  SUP: PPI  Diet: tubefeeds Activity: bedrest  Disposition : ICU     FAMILY  - Updates: 03/30/2017 wife and other family was updated at bedside.  We will need to decrease outside stimulation so he can be easily sedated.  - Inter-disciplinary family meet or Palliative Care meeting due by:  3/23   My cct 46 min   Erick Colace ACNP-BC [place] Pager # [phone] OR # [phone] if no answer

## 2017-04-29 ENCOUNTER — Inpatient Hospital Stay (HOSPITAL_COMMUNITY): Payer: 59

## 2017-04-29 LAB — TRIGLYCERIDES: TRIGLYCERIDES: 321 mg/dL — AB (ref <150)

## 2017-04-29 LAB — BASIC METABOLIC PANEL
Anion gap: 12 (ref 5–15)
BUN: 30 mg/dL — ABNORMAL HIGH (ref 6–20)
CO2: 29 mmol/L (ref 22–32)
CREATININE: 1.06 mg/dL (ref 0.61–1.24)
Calcium: 8.6 mg/dL — ABNORMAL LOW (ref 8.9–10.3)
Chloride: 104 mmol/L (ref 101–111)
Glucose, Bld: 122 mg/dL — ABNORMAL HIGH (ref 65–99)
POTASSIUM: 4 mmol/L (ref 3.5–5.1)
SODIUM: 145 mmol/L (ref 135–145)

## 2017-04-29 LAB — BLOOD GAS, ARTERIAL
ACID-BASE EXCESS: 6.3 mmol/L — AB (ref 0.0–2.0)
BICARBONATE: 30.4 mmol/L — AB (ref 20.0–28.0)
Drawn by: 252031
FIO2: 40
MECHVT: 440 mL
O2 SAT: 88.9 %
PATIENT TEMPERATURE: 98.4
PCO2 ART: 44.7 mmHg (ref 32.0–48.0)
PEEP/CPAP: 5 cmH2O
PH ART: 7.447 (ref 7.350–7.450)
RATE: 28 resp/min
pO2, Arterial: 54.2 mmHg — ABNORMAL LOW (ref 83.0–108.0)

## 2017-04-29 LAB — GLUCOSE, CAPILLARY
GLUCOSE-CAPILLARY: 130 mg/dL — AB (ref 65–99)
GLUCOSE-CAPILLARY: 122 mg/dL — AB (ref 65–99)
GLUCOSE-CAPILLARY: 150 mg/dL — AB (ref 65–99)
Glucose-Capillary: 112 mg/dL — ABNORMAL HIGH (ref 65–99)
Glucose-Capillary: 126 mg/dL — ABNORMAL HIGH (ref 65–99)
Glucose-Capillary: 135 mg/dL — ABNORMAL HIGH (ref 65–99)

## 2017-04-29 LAB — MAGNESIUM: MAGNESIUM: 2.8 mg/dL — AB (ref 1.7–2.4)

## 2017-04-29 LAB — PHOSPHORUS: PHOSPHORUS: 3.6 mg/dL (ref 2.5–4.6)

## 2017-04-29 MED ORDER — FUROSEMIDE 10 MG/ML IJ SOLN
40.0000 mg | Freq: Three times a day (TID) | INTRAMUSCULAR | Status: AC
  Administered 2017-04-29 – 2017-04-30 (×3): 40 mg via INTRAVENOUS
  Filled 2017-04-29 (×4): qty 4

## 2017-04-29 MED ORDER — FUROSEMIDE 10 MG/ML IJ SOLN: 40.0000 mg | Freq: Two times a day (BID) | INTRAMUSCULAR | Status: DC

## 2017-04-29 MED ORDER — PROPOFOL 1000 MG/100ML IV EMUL
0.0000 ug/kg/min | INTRAVENOUS | Status: DC
  Administered 2017-04-29 (×2): 50 ug/kg/min via INTRAVENOUS
  Administered 2017-04-29: 5 ug/kg/min via INTRAVENOUS
  Administered 2017-04-29 – 2017-04-30 (×4): 50 ug/kg/min via INTRAVENOUS
  Filled 2017-04-29 (×6): qty 100
  Filled 2017-04-29: qty 200
  Filled 2017-04-29: qty 100

## 2017-04-29 MED ORDER — HYDROMORPHONE HCL PF 10 MG/ML IJ SOLN
1.0000 mg/h | INTRAMUSCULAR | Status: DC
  Administered 2017-04-29: 1 mg/h via INTRAVENOUS
  Administered 2017-04-30 (×2): 4 mg/h via INTRAVENOUS
  Administered 2017-05-01: 3 mg/h via INTRAVENOUS
  Filled 2017-04-29 (×4): qty 5

## 2017-04-29 MED ORDER — HYDROMORPHONE HCL 1 MG/ML IJ SOLN
INTRAMUSCULAR | Status: AC
  Filled 2017-04-29: qty 0.5

## 2017-04-29 MED ORDER — ALBUTEROL SULFATE (2.5 MG/3ML) 0.083% IN NEBU
2.5000 mg | INHALATION_SOLUTION | RESPIRATORY_TRACT | Status: DC | PRN
Start: 2017-04-29 — End: 2017-05-05
  Administered 2017-04-29: 2.5 mg via RESPIRATORY_TRACT
  Filled 2017-04-29: qty 3

## 2017-04-29 MED ORDER — ACYCLOVIR 5 % EX OINT
TOPICAL_OINTMENT | CUTANEOUS | Status: DC
  Administered 2017-04-29 (×4): via TOPICAL
  Administered 2017-04-30: 1 via TOPICAL
  Administered 2017-04-30: 17:00:00 via TOPICAL
  Administered 2017-04-30: 1 via TOPICAL
  Administered 2017-04-30: 05:00:00 via TOPICAL
  Administered 2017-04-30: 1 via TOPICAL
  Administered 2017-04-30: via TOPICAL
  Administered 2017-05-01 – 2017-05-02 (×8): 1 via TOPICAL
  Administered 2017-05-02: 18:00:00 via TOPICAL
  Administered 2017-05-02: 1 via TOPICAL
  Administered 2017-05-02: 15:00:00 via TOPICAL
  Administered 2017-05-02: 1 via TOPICAL
  Administered 2017-05-03: 17:00:00 via TOPICAL
  Administered 2017-05-03 (×2): 1 via TOPICAL
  Administered 2017-05-03 (×2): via TOPICAL
  Administered 2017-05-03 – 2017-05-04 (×2): 1 via TOPICAL
  Administered 2017-05-04: 08:00:00 via TOPICAL
  Administered 2017-05-04: 1 via TOPICAL
  Administered 2017-05-04: 18:00:00 via TOPICAL
  Administered 2017-05-04: 1 via TOPICAL
  Administered 2017-05-04: 13:00:00 via TOPICAL
  Administered 2017-05-05 – 2017-05-06 (×7): 1 via TOPICAL
  Administered 2017-05-06 – 2017-05-07 (×6): via TOPICAL
  Filled 2017-04-29 (×2): qty 15

## 2017-04-29 MED ORDER — HYDROMORPHONE HCL 1 MG/ML IJ SOLN
1.0000 mg | INTRAMUSCULAR | Status: DC | PRN
  Administered 2017-04-29 – 2017-05-02 (×6): 1 mg via INTRAVENOUS
  Filled 2017-04-29 (×4): qty 1

## 2017-04-29 MED ORDER — POTASSIUM CHLORIDE 20 MEQ/15ML (10%) PO SOLN
40.0000 meq | ORAL | Status: AC
  Administered 2017-04-29 (×2): 40 meq
  Filled 2017-04-29 (×2): qty 30

## 2017-04-29 NOTE — Progress Notes (Signed)
Wasted 50cc of Nimbex, 10cc of Dilaudid, 100cc of Versed in sink with Olean Ree, RN   Thersa Salt, RN

## 2017-04-29 NOTE — Progress Notes (Signed)
PULMONARY / CRITICAL CARE MEDICINE   Name: Richard Dyer MRN: [id number] DOB: Nov 28, 1975    ADMISSION DATE:  04/25/2017 CONSULTATION DATE:  04/25/2017  REFERRING MD:  [facility]   CHIEF COMPLAINT:  Hypoxic Respiratory Failure   HISTORY OF PRESENT ILLNESS:   42 year old male current smoker with PMH of remote DVT.   Presented to ED on 3/12 with one day of fever, sore throat, dyspnea. In ED Tmax 102, rapid strep positive, flu negative. Family noted patient has had recent exposure to flu. CT neck with no peritonsillar abscess, however, patient stridorous. Intubated. Placed on ceftriaxone/clindamycin and dexamethasone. Extubated after 36 hours, however later that evening became hypoxic and tachypneic requiring re-intubation. CT chest with dense bilateral airspace disease with air bronchograms. Transferred to [facility] for further work-up.      SUBJECTIVE:    VITAL SIGNS: BP 124/86 (BP Location: Left Wrist)   Pulse 95   Temp 99.4 F (37.4 C) (Rectal)   Resp (!) 25   Ht 5\' 10"  (1.778 m)   Wt 280 lb 3.3 oz (127.1 kg)   SpO2 94%   BMI 40.21 kg/m   HEMODYNAMICS: CVP:  [4 mmHg-10 mmHg] 4 mmHg  VENTILATOR SETTINGS: Vent Mode: PRVC FiO2 (%):  [40 %-50 %] 50 % Set Rate:  [28 bmp] 28 bmp Vt Set:  [440 mL] 440 mL PEEP:  [10 cmH20] 10 cmH20 Plateau Pressure:  [22 cmH20-26 cmH20] 26 cmH20  INTAKE / OUTPUT:  Intake/Output Summary (Last 24 hours) at 04/29/2017 0854 Last data filed at 04/29/2017 0800 Gross per 24 hour  Intake 3550.72 ml  Output 3250 ml  Net 300.72 ml     PHYSICAL EXAMINATION: General: This is a 42 year old white male is currently heavily sedated on the ventilator and on neuromuscular blockade HEENT: Normocephalic atraumatic.  He does have approximately a dime sized raised scabbed over blister over the right nare.  Mucous membranes are moist sclera nonicteric Pulmonary: Scattered rhonchi, equal chest rise, plateau pressure is 26. Cardiac: Regular rate and  rhythm Extremities/musculoskeletal: Warm dry strong pulses does have some persistent dependent edema Neuro/psych: Heavily sedated on neuromuscular blockade.    LABS:  BMET Recent Labs  Lab 04/27/17 0404 04/28/17 0404 04/29/17 0408  NA 139 140 145  K 3.8 3.3* 4.0  CL 103 103 104  CO2 28 27 29   BUN 20 25* 30*  CREATININE 0.92 0.92 1.06  GLUCOSE 158* 136* 122*    Electrolytes Recent Labs  Lab 04/27/17 0404 04/28/17 0404 04/29/17 0408  CALCIUM 8.3* 8.4* 8.6*  MG 2.4 2.6* 2.8*  PHOS 2.7 2.7 3.6    CBC Recent Labs  Lab 04/26/17 0039 04/27/17 0404 04/28/17 0404  WBC 15.7* 22.7* 14.4*  HGB 15.4 14.0 12.4*  HCT 43.3 40.8 36.7*  PLT 246 253 248    Coag's No results for input(s): APTT, INR in the last 168 hours.  Sepsis Markers Recent Labs  Lab 04/26/17 0037 04/27/17 0404 04/28/17 0404  LATICACIDVEN 1.2  --   --   PROCALCITON 0.48 0.47 0.66    ABG Recent Labs  Lab 04/27/17 1325 04/28/17 1044 04/29/17 0330  PHART 7.220* 7.421 7.447  PCO2ART 82.8* 48.9* 44.7  PO2ART 81.3* 56.0* 54.2*    Liver Enzymes Recent Labs  Lab 04/26/17 0037  AST 19  ALT 19  ALKPHOS 53  BILITOT 1.5*  ALBUMIN 3.1*    Cardiac Enzymes Recent Labs  Lab 04/26/17 0037  TROPONINI <0.03    Glucose Recent Labs  Lab 04/28/17  1660 04/28/17 1212 04/28/17 1517 04/28/17 2008 04/28/17 2337 04/29/17 0419  GLUCAP 100* 125* 107* 125* 122* 130*    Imaging Dg Chest Port 1 View  Result Date: 04/29/2017 CLINICAL DATA:  Pneumonia. EXAM: PORTABLE CHEST 1 VIEW COMPARISON:  04/28/2017. FINDINGS: Endotracheal tube, NG tube, left IJ line in stable position. Cardiomegaly unchanged. Diffuse bilateral pulmonary infiltrates/edema unchanged. No prominent pleural effusion. Stable biapical pleural thickening. No pneumothorax. IMPRESSION: 1.  Lines and tubes in stable position. 2.  Cardiomegaly unchanged. 3. Persistent bilateral pulmonary infiltrates/edema unchanged. Chest is unchanged from  prior exam. Electronically Signed   By: Marcello Moores  Register   On: 04/29/2017 07:18     STUDIES:  CT Chest 3/15 > Dense Confluent Bilateral airspace disease with air bronchograms is suggestive of severe pulmonary edema/ARDS. Diffuse pulmonary hemorrhage or infection could have similar pattern, no mediastinal lymphadenopathy, no mass in the airway or lung   CULTURES: Blood 3/16 >> Sputum 3/16 >> normal flora   ANTIBIOTICS: Ceftriaxone 3/12 >3/15 Clindamycin 3/12 >3/15 Vancomycin 3/15 >> 04/28/2007 Cefepime 3/15 >> 316 Azithromycin 3/15 >> 316 Zosyn 04/26/2017>>  SIGNIFICANT EVENTS: 3/12 > Presented to ED, Intubated  3/14 > Extubated  3/15 > Re-intubated  3/15 > Transferred to [facility]   LINES/TUBES: ETT 3/12 > 3/14 ETT 3/14 >>   DISCUSSION: 42 year old male who originally presented on 312 with fever sore throat and progressed to acute respiratory failure now ARDS.  He was placed on neuromuscular blockade on 3/17.  He is now 48 hours into neuromuscular blockade.  His PEEP/FiO2 requirements have stabilized, but not changed much in comparison to late last afternoon.  Chest x-ray stable.  Plan for today is to transition off from neuromuscular blockade, we will try propofol with RASS goal of -1--2 and utilize PRN Dilaudid for pain assuming transition goes smoothly tomorrow will assess to transition him to Precedex, would like to get him out of bed soon but need to ensure transition from heavy sedation goes smoothly first.  We will continue Lasix today, of escalated dosing aiming for a negative fluid balance.  ASSESSMENT / PLAN:  Acute Hypoxic Respiratory Failure in setting of Severe ARDS secondary to presumed CAP, +Strep A with noted pharyngitis  H/O Tobacco Abuse  -CT scan consistent with ARDS -placed on NMB 3/17-->dc 3/19 -PCXR: no sig change in bilateral airspace disease. 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  We will need to decrease outside stimulation so he can be easily sedated.  - Inter-disciplinary family meet or Palliative Care meeting due by:  3/23   My cct 54 min   Erick Colace ACNP-BC [place] Pager # [phone] OR # [phone] if no answer

## 2017-04-29 NOTE — Progress Notes (Signed)
Rt called to pt's room due to desat low 80's after pt was turned by nursing.  RT bag lavaged pt and was able to suction a moderate amount on pink/blood tinged plugs.  MD notified.

## 2017-04-29 NOTE — Progress Notes (Signed)
Pharmacy Antibiotic Note  Richard Dyer is a 42 y.o. male admitted on 04/25/2017 with pneumonia.  Pharmacy has been consulted for Zosyn dosing.  Today is Day 8 of antibiotics, day 4 of Zosyn. Patient still with low grade-fevers, WBC is trending down, PCT 0.47. He has been on short courses of several antibiotics this admission with minimal improvement. Repeat chest X-ray yesterday showed some improvement in consolidations. The tentative plan is to treat with broad spectrum antibiotics for a total of 10 days.   Plan: Zosyn 3.375g IV q8h (4 hour infusion). - stop date 3/21 in place Pharmacy will sign off - will monitor clinical status, renal function peripherally   Height: 5\' 10"  (177.8 cm) Weight: 280 lb 3.3 oz (127.1 kg) IBW/kg (Calculated) : 73  Temp (24hrs), Avg:98.7 F (37.1 C), Min:97.1 F (36.2 C), Max:100 F (37.8 C)  Recent Labs  Lab 04/26/17 0037 04/26/17 0039 04/27/17 0404 04/28/17 0404 04/29/17 0408  WBC  --  15.7* 22.7* 14.4*  --   CREATININE  --  1.19 0.92 0.92 1.06  LATICACIDVEN 1.2  --   --   --   --     Estimated Creatinine Clearance: 125.2 mL/min (by C-G formula based on SCr of 1.06 mg/dL).    Allergies  Allergen Reactions  . 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   Charlene Brooke, PharmD PGY1 Pharmacy Resident Pager: [phone] After 4:00PM please call Main Pharmacy [phone] 04/29/2017  9:22 AM

## 2017-04-30 ENCOUNTER — Inpatient Hospital Stay (HOSPITAL_COMMUNITY): Payer: 59

## 2017-04-30 LAB — BLOOD GAS, ARTERIAL
Acid-Base Excess: 9.5 mmol/L — ABNORMAL HIGH (ref 0.0–2.0)
BICARBONATE: 35.3 mmol/L — AB (ref 20.0–28.0)
Drawn by: 252031
FIO2: 70
LHR: 24 {breaths}/min
MECHVT: 440 mL
O2 SAT: 86.6 %
PEEP: 10 cmH2O
PH ART: 7.339 — AB (ref 7.350–7.450)
Patient temperature: 98.6
pCO2 arterial: 67.4 mmHg (ref 32.0–48.0)
pO2, Arterial: 55.9 mmHg — ABNORMAL LOW (ref 83.0–108.0)

## 2017-04-30 LAB — PHOSPHORUS: Phosphorus: 4.9 mg/dL — ABNORMAL HIGH (ref 2.5–4.6)

## 2017-04-30 LAB — BASIC METABOLIC PANEL
Anion gap: 13 (ref 5–15)
BUN: 34 mg/dL — AB (ref 6–20)
CHLORIDE: 104 mmol/L (ref 101–111)
CO2: 31 mmol/L (ref 22–32)
Calcium: 8.4 mg/dL — ABNORMAL LOW (ref 8.9–10.3)
Creatinine, Ser: 1.18 mg/dL (ref 0.61–1.24)
GFR calc Af Amer: >60 mL/min (ref >60)
GFR calc non Af Amer: >60 mL/min (ref >60)
Glucose, Bld: 163 mg/dL — ABNORMAL HIGH (ref 65–99)
POTASSIUM: 3.8 mmol/L (ref 3.5–5.1)
Sodium: 148 mmol/L — ABNORMAL HIGH (ref 135–145)

## 2017-04-30 LAB — GLUCOSE, CAPILLARY
GLUCOSE-CAPILLARY: 140 mg/dL — AB (ref 65–99)
Glucose-Capillary: 146 mg/dL — ABNORMAL HIGH (ref 65–99)
Glucose-Capillary: 145 mg/dL — ABNORMAL HIGH (ref 65–99)
Glucose-Capillary: 142 mg/dL — ABNORMAL HIGH (ref 65–99)
Glucose-Capillary: 171 mg/dL — ABNORMAL HIGH (ref 65–99)

## 2017-04-30 LAB — TRIGLYCERIDES: Triglycerides: 368 mg/dL — ABNORMAL HIGH (ref <150)

## 2017-04-30 LAB — MAGNESIUM: Magnesium: 2.8 mg/dL — ABNORMAL HIGH (ref 1.7–2.4)

## 2017-04-30 MED ORDER — SODIUM CHLORIDE 0.9 % IV SOLN
0.0000 mg/h | INTRAVENOUS | Status: DC
  Administered 2017-04-30 (×2): 2 mg/h via INTRAVENOUS
  Filled 2017-04-30 (×4): qty 10

## 2017-04-30 MED ORDER — SODIUM CHLORIDE 3 % IN NEBU
4.0000 mL | INHALATION_SOLUTION | Freq: Two times a day (BID) | RESPIRATORY_TRACT | Status: DC
  Administered 2017-04-30 – 2017-05-01 (×3): 4 mL via RESPIRATORY_TRACT
  Filled 2017-04-30 (×4): qty 4

## 2017-04-30 MED ORDER — FREE WATER
200.0000 mL | Freq: Three times a day (TID) | Status: DC
  Administered 2017-04-30 – 2017-05-01 (×5): 200 mL

## 2017-04-30 MED ORDER — MIDAZOLAM BOLUS VIA INFUSION
1.0000 mg | INTRAVENOUS | Status: DC | PRN
  Filled 2017-04-30: qty 2

## 2017-04-30 MED ORDER — FREE WATER: 200.0000 mL | Freq: Three times a day (TID) | Status: DC

## 2017-04-30 MED FILL — Fentanyl Citrate Preservative Free (PF) Inj 100 MCG/2ML: INTRAMUSCULAR | Qty: 2 | Status: AC

## 2017-04-30 NOTE — Progress Notes (Addendum)
Cortrak Tube Team Note:  Consult received to place a Cortrak feeding tube.   A 10 F Cortrak tube was placed in the left nare and secured with tape at 100 cm. Per the Cortrak monitor reading the tube tip is post pyloric.   Attempted bridle placement x2. Unable to obtain at this time.   X-ray is required, abdominal x-ray has been ordered by the Cortrak team. Please confirm tube placement before using the Cortrak tube.   If the tube becomes dislodged please keep the tube and contact the Cortrak team at [email or web address] (password [email or web address]) for replacement.  If after hours and replacement cannot be delayed, place a NG tube and confirm placement with an abdominal x-ray.    Mariana Single RD, LDN Clinical Nutrition Pager # [phone]

## 2017-04-30 NOTE — Progress Notes (Signed)
PULMONARY / CRITICAL CARE MEDICINE   Name: Richard Dyer MRN: [id number] DOB: [birth year]    ADMISSION DATE:  04/25/2017 CONSULTATION DATE:  04/25/2017  REFERRING MD:  [facility]   CHIEF COMPLAINT:  Hypoxic Respiratory Failure   HISTORY OF PRESENT ILLNESS:   42 year old male current smoker with PMH of remote DVT.   Presented to ED on 3/12 with one day of fever, sore throat, dyspnea. In ED Tmax 102, rapid strep positive, flu negative. Family noted patient has had recent exposure to flu. CT neck with no peritonsillar abscess, however, patient stridorous. Intubated. Placed on ceftriaxone/clindamycin and dexamethasone. Extubated after 36 hours, however later that evening became hypoxic and tachypneic requiring re-intubation. CT chest with dense bilateral airspace disease with air bronchograms. Transferred to [facility] for further work-up.    SUBJECTIVE:  Heavily sedated, FiO2 and PEEP requirements have increased overnight  VITAL SIGNS: BP 101/68 (BP Location: Left Wrist)   Pulse 76   Temp 98.4 F (36.9 C) (Core)   Resp (!) 24   Ht 5\' 10"  (1.778 m)   Wt 280 lb 3.3 oz (127.1 kg)   SpO2 98%   BMI 40.21 kg/m   HEMODYNAMICS: CVP:  [15 mmHg] 15 mmHg  VENTILATOR SETTINGS: Vent Mode: PRVC FiO2 (%):  [50 %-100 %] 70 % Set Rate:  [24 bmp] 24 bmp Vt Set:  [440 mL] 440 mL PEEP:  [10 cmH20-16 cmH20] 16 cmH20 Plateau Pressure:  [24 cmH20-28 cmH20] 24 cmH20  INTAKE / OUTPUT:  Intake/Output Summary (Last 24 hours) at 04/30/2017 2130 Last data filed at 04/30/2017 0800 Gross per 24 hour  Intake 2738.84 ml  Output 3590 ml  Net -851.16 ml     PHYSICAL EXAMINATION: General: This is a 42 year old white male is currently sedated on high doses of propofol in Dilaudid  HEENT: Orally intubated.  Nasal lesion unchanged, currently coated with medication.  Orally intubated mucous membranes moist Pulmonary: Scattered rhonchi equal chest rise plateau pressures acceptable Cardiac: Regular  rate and rhythm Extremities/muscular skeletal: Heavily sedated, moves extremities, mild dependent edema brisk cap refill strong pulses warm and dry Neuro: Heavily sedated GU: Clear yellow Abdomen: Soft, liquid stools from rectal tube    LABS:  BMET Recent Labs  Lab 04/28/17 0404 04/29/17 0408 04/30/17 0450  NA 140 145 148*  K 3.3* 4.0 3.8  CL 103 104 104  CO2 27 29 31   BUN 25* 30* 34*  CREATININE 0.92 1.06 1.18  GLUCOSE 136* 122* 163*    Electrolytes Recent Labs  Lab 04/28/17 0404 04/29/17 0408 04/30/17 0450  CALCIUM 8.4* 8.6* 8.4*  MG 2.6* 2.8* 2.8*  PHOS 2.7 3.6 4.9*    CBC Recent Labs  Lab 04/26/17 0039 04/27/17 0404 04/28/17 0404  WBC 15.7* 22.7* 14.4*  HGB 15.4 14.0 12.4*  HCT 43.3 40.8 36.7*  PLT 246 253 248    Coag's No results for input(s): APTT, INR in the last 168 hours.  Sepsis Markers Recent Labs  Lab 04/26/17 0037 04/27/17 0404 04/28/17 0404  LATICACIDVEN 1.2  --   --   PROCALCITON 0.48 0.47 0.66    ABG Recent Labs  Lab 04/28/17 1044 04/29/17 0330 04/30/17 0417  PHART 7.421 7.447 7.339*  PCO2ART 48.9* 44.7 67.4*  PO2ART 56.0* 54.2* 55.9*    Liver Enzymes Recent Labs  Lab 04/26/17 0037  AST 19  ALT 19  ALKPHOS 53  BILITOT 1.5*  ALBUMIN 3.1*    Cardiac Enzymes Recent Labs  Lab 04/26/17 0037  TROPONINI <0.03  Glucose Recent Labs  Lab 04/29/17 1222 04/29/17 1537 04/29/17 1936 04/29/17 2340 04/30/17 0405 04/30/17 0817  GLUCAP 126* 122* 150* 135* 140* 146*    Imaging Dg Chest Port 1 View  Result Date: 04/30/2017 CLINICAL DATA:  Hypoxia EXAM: PORTABLE CHEST 1 VIEW COMPARISON:  April 29, 2017 FINDINGS: Endotracheal tube tip is 3.2 cm above the carina. Nasogastric tube tip is at the junction of the left innominate vein and superior vena cava. Nasogastric tube tip and side port are below the diaphragm. No pneumothorax. There remains patchy airspace opacity in the mid lower lung zones without appreciable  change. No new opacity evident. Heart is mildly enlarged with pulmonary vascularity within normal limits. No adenopathy appreciable. No bone lesions. IMPRESSION: Patchy airspace opacity bilaterally. Suspect multifocal pneumonia, although there may be a degree of underlying pulmonary edema. Stable cardiomegaly. Tube and catheter positions are unchanged without pneumothorax. Note that there may also be a degree of underlying ARDS. Electronically Signed   By: Lowella Grip III M.D.   On: 04/30/2017 07:45     STUDIES:  CT Chest 3/15 > Dense Confluent Bilateral airspace disease with air bronchograms is suggestive of severe pulmonary edema/ARDS. Diffuse pulmonary hemorrhage or infection could have similar pattern, no mediastinal lymphadenopathy, no mass in the airway or lung   CULTURES: Blood 3/16 >> Sputum 3/16 >> normal flora   ANTIBIOTICS: Ceftriaxone 3/12 >3/15 Clindamycin 3/12 >3/15 Vancomycin 3/15 >> 04/28/2007 Cefepime 3/15 >> 316 Azithromycin 3/15 >> 316 Zosyn 04/26/2017>>  SIGNIFICANT EVENTS: 3/12 > Presented to ED, Intubated  3/14 > Extubated  3/15 > Re-intubated  3/15 > Transferred to [facility]   LINES/TUBES: ETT 3/12 > 3/14 ETT 3/14 >>   DISCUSSION: 42 year old male who originally presented on 312 with fever sore throat and progressed to acute respiratory failure now ARDS.  He was placed on neuromuscular blockade on 3/17.  He is now 48 hours into neuromuscular blockade.  His PEEP/FiO2 requirements have stabilized, but not changed much in comparison to late last afternoon.  Chest x-ray stable.  Plan for today is to transition off from neuromuscular blockade, we will try propofol with RASS goal of -1--2 and utilize PRN Dilaudid for pain assuming transition goes smoothly tomorrow will assess to transition him to Precedex, would like to get him out of bed soon but need to ensure transition from heavy sedation goes smoothly first.  We will continue Lasix today, of escalated dosing  aiming for a negative fluid balance.  ASSESSMENT / PLAN:  Acute Hypoxic Respiratory Failure in setting of Severe ARDS secondary to presumed CAP, +Strep A with noted pharyngitis  H/O Tobacco Abuse  -CT scan consistent with ARDS -placed on NMB 3/17-->dc 3/19 -PCXR: Endotracheal tube and left IJ line are in satisfactory position.  Lower volume film today however more progression of airspace disease when comparing films Also on higher FiO2 and PEEP Plan Continuing ARDS protocol. 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  We will need to decrease outside stimulation so he can be easily sedated.  - Inter-disciplinary family meet or Palliative Care meeting due by:  3/23   My cct 52 min    Erick Colace ACNP-BC [place] Pager # [phone] OR # [phone] if no answer

## 2017-04-30 NOTE — Progress Notes (Signed)
[place] Progress Note Patient Name: Richard Dyer DOB: 10/18/1975 MRN: [id number]   Date of Service  04/30/2017  HPI/Events of Note  ABG on 70%/PRVC 24/TV 440/P 10 = 7.339/67/55.9/35.3. CXR with dense bilateral infiltrates. Respiratory Therapy did a recruitment maneuver and increased his PEEP to 16. Pplat acceptable.   eICU Interventions  Continue present management. May need to consider proning if he continues to do poorly.      Intervention Category Major Interventions: Hypoxemia - evaluation and management;Respiratory failure - evaluation and management  Teddy Rebstock Cornelia Copa 04/30/2017, 4:54 AM

## 2017-05-01 ENCOUNTER — Inpatient Hospital Stay (HOSPITAL_COMMUNITY): Payer: 59

## 2017-05-01 LAB — CBC
HCT: 37.9 % — ABNORMAL LOW (ref 39.0–52.0)
HEMOGLOBIN: 11.8 g/dL — AB (ref 13.0–17.0)
MCH: 30.6 pg (ref 26.0–34.0)
MCHC: 31.1 g/dL (ref 30.0–36.0)
MCV: 98.2 fL (ref 78.0–100.0)
Platelets: 325 10*3/uL (ref 150–400)
RBC: 3.86 MIL/uL — AB (ref 4.22–5.81)
RDW: 14.7 % (ref 11.5–15.5)
WBC: 14 10*3/uL — ABNORMAL HIGH (ref 4.0–10.5)

## 2017-05-01 LAB — CULTURE, BLOOD (ROUTINE X 2)
CULTURE: NO GROWTH
Culture: NO GROWTH
Special Requests: ADEQUATE
Special Requests: ADEQUATE

## 2017-05-01 LAB — BASIC METABOLIC PANEL
Anion gap: 12 (ref 5–15)
BUN: 31 mg/dL — ABNORMAL HIGH (ref 6–20)
CHLORIDE: 102 mmol/L (ref 101–111)
CO2: 33 mmol/L — ABNORMAL HIGH (ref 22–32)
CREATININE: 1.08 mg/dL (ref 0.61–1.24)
Calcium: 8.7 mg/dL — ABNORMAL LOW (ref 8.9–10.3)
GFR calc non Af Amer: >60 mL/min (ref >60)
Glucose, Bld: 150 mg/dL — ABNORMAL HIGH (ref 65–99)
Potassium: 3.9 mmol/L (ref 3.5–5.1)
SODIUM: 147 mmol/L — AB (ref 135–145)

## 2017-05-01 LAB — GLUCOSE, CAPILLARY
GLUCOSE-CAPILLARY: 140 mg/dL — AB (ref 65–99)
GLUCOSE-CAPILLARY: 152 mg/dL — AB (ref 65–99)
GLUCOSE-CAPILLARY: 149 mg/dL — AB (ref 65–99)
Glucose-Capillary: 121 mg/dL — ABNORMAL HIGH (ref 65–99)
Glucose-Capillary: 136 mg/dL — ABNORMAL HIGH (ref 65–99)
Glucose-Capillary: 143 mg/dL — ABNORMAL HIGH (ref 65–99)

## 2017-05-01 LAB — TRIGLYCERIDES: Triglycerides: 328 mg/dL — ABNORMAL HIGH (ref <150)

## 2017-05-01 MED ORDER — ONDANSETRON HCL 4 MG/2ML IJ SOLN
4.0000 mg | Freq: Once | INTRAMUSCULAR | Status: AC
  Administered 2017-05-01: 4 mg via INTRAVENOUS
  Filled 2017-05-01: qty 2

## 2017-05-01 MED ORDER — FAMOTIDINE 40 MG/5ML PO SUSR
20.0000 mg | Freq: Two times a day (BID) | ORAL | Status: DC
  Administered 2017-05-01 – 2017-05-02 (×2): 20 mg via ORAL
  Filled 2017-05-01 (×4): qty 2.5

## 2017-05-01 MED ORDER — ENOXAPARIN SODIUM 60 MG/0.6ML ~~LOC~~ SOLN
60.0000 mg | SUBCUTANEOUS | Status: DC
  Administered 2017-05-01 – 2017-05-06 (×6): 60 mg via SUBCUTANEOUS
  Filled 2017-05-01 (×7): qty 0.6

## 2017-05-01 MED ORDER — ENOXAPARIN SODIUM 60 MG/0.6ML ~~LOC~~ SOLN
60.0000 mg | SUBCUTANEOUS | Status: DC
  Filled 2017-05-01: qty 0.6

## 2017-05-01 MED ORDER — NAPHAZOLINE-GLYCERIN 0.012-0.2 % OP SOLN
1.0000 [drp] | Freq: Four times a day (QID) | OPHTHALMIC | Status: DC | PRN
  Administered 2017-05-02: 2 [drp] via OPHTHALMIC
  Filled 2017-05-01: qty 15

## 2017-05-01 MED ORDER — PSYLLIUM 95 % PO PACK
1.0000 | PACK | Freq: Two times a day (BID) | ORAL | Status: DC
  Administered 2017-05-01 – 2017-05-02 (×2): 1 via ORAL
  Filled 2017-05-01 (×2): qty 1

## 2017-05-01 NOTE — Progress Notes (Signed)
[place] Progress Note Patient Name: Richard Dyer DOB: [birth year] MRN: [id number]   Date of Service  05/01/2017  HPI/Events of Note  Pt c/o nausea  eICU Interventions  - zofran x 1 - goal to continue TF if possible.      Intervention Category Intermediate Interventions: Other:  Collene Gobble 05/01/2017, 10:46 PM

## 2017-05-01 NOTE — Progress Notes (Signed)
[place] Progress Note Patient Name: Richard Dyer DOB: [birth year] MRN: [id number]   Date of Service  05/01/2017  HPI/Events of Note  Intermittent delirium with danger to self  eICU Interventions  Soft restraints ordered     Intervention Category Minor Interventions: Agitation / anxiety - evaluation and management  Collene Gobble 05/01/2017, 9:00 PM

## 2017-05-01 NOTE — Progress Notes (Signed)
Nutrition Follow-up  DOCUMENTATION CODES:   Obesity unspecified  INTERVENTION:   Continue:  Vital High Protein via Cortrak tube at 65 ml/h (1560 ml/day) with Prostat 30 ml once daily to provide 1660 kcals, 152 gm protein, 1304 ml free water daily.   Free water flushes 200 ml every 8 hours.  NUTRITION DIAGNOSIS:   Inadequate oral intake related to inability to eat as evidenced by NPO status.  Ongoing  GOAL:   Provide needs based on ASPEN/SCCM guidelines  Met with TF  MONITOR:   Vent status, Labs, Weight trends, Diet advancement, TF tolerance  ASSESSMENT:   42 year old male with no PMH, who was admitted on 3/15 with severe ARDS, CAP, and strep A as a transfer from [facility]. Intubated at [place] prior to transfer.  Discussed patient in ICU rounds and with RN today. Cortrak tube was placed on 3/20. Tube tip is at the pylorus.  Patient is currently receiving Vital High Protein via Cortrak tube at 65 ml/h (1560 ml/day) with Prostat 30 ml once daily to provide 1660 kcals, 152 gm protein, 1304 ml free water daily. Free water flushes 200 ml every 8 hours. Tolerating TF well.  Patient is currently intubated on ventilator support MV: 10.6 L/min Temp (24hrs), Avg:99 F (37.2 C), Min:98.2 F (36.8 C), Max:99.4 F (37.4 C)  Labs reviewed. Sodium 147 (H) CBG's: [phone] Medications reviewed.  Diet Order:  No diet orders on file  EDUCATION NEEDS:   No education needs have been identified at this time  Skin:  Skin Assessment: Reviewed RN Assessment  Last BM:  3/21  Height:   Ht Readings from Last 1 Encounters:  04/25/17 '5\' 10"'  (1.778 m)    Weight:   Wt Readings from Last 1 Encounters:  05/01/17 280 lb 3.3 oz (127.1 kg)    Ideal Body Weight:  75.45 kg  BMI:  Body mass index is 40.21 kg/m.  Estimated Nutritional Needs:   Kcal:  1375-1750 kcals (11-14 kcal/kg bw)  Protein:  >151 g Pro (2g/kg ibw)  Fluid:  2 L    Molli Barrows, RD,  LDN, [place] Pager [phone] After Hours Pager [phone]

## 2017-05-01 NOTE — Care Management Note (Addendum)
Case Management Note  Patient Details  Name: Richard Dyer MRN: [id number] Date of Birth: 1975/10/17  Subjective/Objective:       Pt admitted with PNA - ARDS             Action/Plan:  PTA from with wife home.  Pt remains on vent.  CM will continue to follow   Expected Discharge Date:                  Expected Discharge Plan:     In-House Referral:  Clinical Social Work  Discharge planning Services  CM Consult  Post Acute Care Choice:    Choice offered to:     DME Arranged:    DME Agency:     HH Arranged:    HH Agency:     Status of Service:     If discussed at [employer], dates discussed:    Additional Comments:  Maryclare Labrador, RN 05/01/2017, 11:16 AM

## 2017-05-01 NOTE — Progress Notes (Signed)
Critical care attending progress note:  Reason for admission: ARDS secondary to community acquired pneumonia   Summary of admission history and hospital course: 42 year old previously healthy man who developed abrupt fever and shortness of breath.  Intubated for ARDS due to community-acquired pneumonia likely secondary to group A streptococcus as he recently had an episode of strep throat.  Required paralysis initially.  Has been off neuromuscular blockade since 3/19   The following issues were addressed on rounds today:  1.  Respiratory failure: ARDS secondary to pneumonia requiring mechanical ventilation.  Patient is on PRVC.  Peak pressures are now 26.  FiO2 down to 0.3, PEEP titrated to 14.  Stress strain still indicates optimal PEEP.  On exam, chest is now clear throughout and the bronchial breathing at the right base has resolved.  Minimal secretions with chest physiotherapy.  Chest x-ray shows improving bilateral infiltrates. -Resolving ARDS.  Continue current ventilator weaning strategy.  Would advise slow PEEP reduction no faster than every 12 hours.  2.  Community-acquired pneumonia: Has completed course of antibiotics today.  3.  Nasal HSV eruption.  Improving with Zovirax ointment.  Safety and quality of care items:  Sedation vacation with dose reduction: Now off midazolam.  Weaning Dilaudid infusion.  Patient is not delirious.  Follows commands appropriately. Early mobility: Initiate early motor ability once PEEP at 10. VAP bundle: Yes Appropriate for SBT: Initiate once PEEP at 8. Line removal: Still needs central line. Stress ulcer prophylaxis: Switch Protonix to Pepcid. Laxative regimen: Tube feed diarrhea.  Will start Metamucil.  Hold senna. Nutritional status: Adequate nutrition Enteral nutrition: Vital HP at goal. Foley catheter removal: Still required to monitor urine output and to protect skin Renal dose adjustment: Normal renal function Electrolyte repletion:  Hypernatremia improving with free water flushes. DVT prophylaxis: Switch to Lovenox.  Platelet count 325. Transfusion indications: Hemoglobin 11.8.  No indications for transfusion. Antibiotic de-escalation: Complete antibiotic course today Sepsis care elements: Currently not septic. Glycemic target: Euglycemic on no treatment. Steroid use: On no steroids.  Given improvement we will not initiate steroids for late-phase ARDS.  Summary and disposition:  Patient remains critically ill due to respiratory failure requiring mechanical ventilation.  They remain at high risk for life/limb threatening clinical deterioration requiring frequent reassessment and modifications of care.  Services provided include examination of the patient, review of relevant ancillary tests, prescription of lifesaving therapies, review of medications and prophylactic therapy and multidisciplinary rounding.  Total critical care time excluding separately billable procedures: 40 minutes.  Kipp Brood, MD Critical Care Attending. [facility]. Pager: [phone] After hours: [phone])

## 2017-05-02 ENCOUNTER — Inpatient Hospital Stay (HOSPITAL_COMMUNITY): Payer: 59

## 2017-05-02 LAB — GLUCOSE, CAPILLARY
GLUCOSE-CAPILLARY: 115 mg/dL — AB (ref 65–99)
GLUCOSE-CAPILLARY: 124 mg/dL — AB (ref 65–99)
Glucose-Capillary: 111 mg/dL — ABNORMAL HIGH (ref 65–99)
Glucose-Capillary: 123 mg/dL — ABNORMAL HIGH (ref 65–99)
Glucose-Capillary: 126 mg/dL — ABNORMAL HIGH (ref 65–99)
Glucose-Capillary: 127 mg/dL — ABNORMAL HIGH (ref 65–99)
Glucose-Capillary: 132 mg/dL — ABNORMAL HIGH (ref 65–99)

## 2017-05-02 LAB — BASIC METABOLIC PANEL
Anion gap: 14 (ref 5–15)
BUN: 29 mg/dL — ABNORMAL HIGH (ref 6–20)
CHLORIDE: 108 mmol/L (ref 101–111)
CO2: 26 mmol/L (ref 22–32)
Calcium: 9.7 mg/dL (ref 8.9–10.3)
Creatinine, Ser: 1.2 mg/dL (ref 0.61–1.24)
GFR calc non Af Amer: >60 mL/min (ref >60)
Glucose, Bld: 132 mg/dL — ABNORMAL HIGH (ref 65–99)
POTASSIUM: 3.8 mmol/L (ref 3.5–5.1)
SODIUM: 148 mmol/L — AB (ref 135–145)

## 2017-05-02 LAB — CULTURE, RESPIRATORY

## 2017-05-02 LAB — CBC WITH DIFFERENTIAL/PLATELET
Basophils Absolute: 0.1 10*3/uL (ref 0.0–0.1)
Basophils Relative: 0 %
Eosinophils Absolute: 0.4 10*3/uL (ref 0.0–0.7)
Eosinophils Relative: 3 %
HCT: 40.6 % (ref 39.0–52.0)
Hemoglobin: 12.9 g/dL — ABNORMAL LOW (ref 13.0–17.0)
Lymphocytes Relative: 13 %
Lymphs Abs: 2 10*3/uL (ref 0.7–4.0)
MCH: 30.6 pg (ref 26.0–34.0)
MCHC: 31.8 g/dL (ref 30.0–36.0)
MCV: 96.2 fL (ref 78.0–100.0)
Monocytes Absolute: 1.3 10*3/uL — ABNORMAL HIGH (ref 0.1–1.0)
Monocytes Relative: 8 %
Neutro Abs: 12.2 10*3/uL — ABNORMAL HIGH (ref 1.7–7.7)
Neutrophils Relative %: 76 %
Platelets: 336 10*3/uL (ref 150–400)
RBC: 4.22 MIL/uL (ref 4.22–5.81)
RDW: 14.1 % (ref 11.5–15.5)
WBC: 16 10*3/uL — ABNORMAL HIGH (ref 4.0–10.5)

## 2017-05-02 LAB — LIPASE, BLOOD: LIPASE: 93 U/L — AB (ref 11–51)

## 2017-05-02 LAB — TRIGLYCERIDES: TRIGLYCERIDES: 271 mg/dL — AB (ref <150)

## 2017-05-02 LAB — HEPATIC FUNCTION PANEL
ALT: 241 U/L — AB (ref 17–63)
AST: 193 U/L — AB (ref 15–41)
Albumin: 2.9 g/dL — ABNORMAL LOW (ref 3.5–5.0)
Alkaline Phosphatase: 75 U/L (ref 38–126)
BILIRUBIN INDIRECT: 1.2 mg/dL — AB (ref 0.3–0.9)
Bilirubin, Direct: 0.7 mg/dL — ABNORMAL HIGH (ref 0.1–0.5)
TOTAL PROTEIN: 8.1 g/dL (ref 6.5–8.1)
Total Bilirubin: 1.9 mg/dL — ABNORMAL HIGH (ref 0.3–1.2)

## 2017-05-02 LAB — CULTURE, RESPIRATORY W GRAM STAIN: Culture: NO GROWTH

## 2017-05-02 LAB — AMYLASE: Amylase: 211 U/L — ABNORMAL HIGH (ref 28–100)

## 2017-05-02 MED ORDER — FREE WATER
200.0000 mL | Status: DC
  Administered 2017-05-02: 200 mL

## 2017-05-02 NOTE — Progress Notes (Addendum)
PULMONARY / CRITICAL CARE MEDICINE   Name: Kole Hilyard MRN: [id number] DOB: May 07, 1975    ADMISSION DATE:  04/25/2017 CONSULTATION DATE:  04/25/2017  REFERRING MD:  [facility]   CHIEF COMPLAINT:  Hypoxic Respiratory Failure   HISTORY OF PRESENT ILLNESS:   42 year old male current smoker with PMH of remote DVT.   Presented to ED on 3/12 with one day of fever, sore throat, dyspnea. In ED Tmax 102, rapid strep positive, flu negative. Family noted patient has had recent exposure to flu. CT neck with no peritonsillar abscess, however, patient stridorous. Intubated. Placed on ceftriaxone/clindamycin and dexamethasone. Extubated after 36 hours, however later that evening became hypoxic and tachypneic requiring re-intubation. CT chest with dense bilateral airspace disease with air bronchograms. Transferred to [facility] for further work-up.    SUBJECTIVE:  Vomited last night   VITAL SIGNS: Blood Pressure (Abnormal) 141/80   Pulse 85   Temperature 99.7 F (37.6 C) (Rectal)   Respiration 16   Height 5\' 10"  (1.778 m)   Weight 272 lb 7.8 oz (123.6 kg)   Oxygen Saturation 98%   Body Mass Index 39.10 kg/m   HEMODYNAMICS:    VENTILATOR SETTINGS: Vent Mode: PRVC FiO2 (%):  [30 %-40 %] 30 % Set Rate:  [24 bmp] 24 bmp Vt Set:  [440 mL] 440 mL PEEP:  [12 cmH20-14 cmH20] 12 cmH20 Plateau Pressure:  [21 cmH20-26 cmH20] 21 cmH20  INTAKE / OUTPUT:  Intake/Output Summary (Last 24 hours) at 05/02/2017 1130 Last data filed at 05/02/2017 1000 Gross per 24 hour  Intake 1633.18 ml  Output 3560 ml  Net -1926.82 ml     PHYSICAL EXAMINATION:     LABS:  BMET Recent Labs  Lab 04/30/17 0450 05/01/17 0500 05/02/17 0906  NA 148* 147* 148*  K 3.8 3.9 3.8  CL 104 102 108  CO2 31 33* 26  BUN 34* 31* 29*  CREATININE 1.18 1.08 1.20  GLUCOSE 163* 150* 132*    Electrolytes Recent Labs  Lab 04/28/17 0404 04/29/17 0408 04/30/17 0450 05/01/17 0500 05/02/17 0906  CALCIUM  8.4* 8.6* 8.4* 8.7* 9.7  MG 2.6* 2.8* 2.8*  --   --   PHOS 2.7 3.6 4.9*  --   --     CBC Recent Labs  Lab 04/28/17 0404 05/01/17 0500 05/02/17 0906  WBC 14.4* 14.0* 16.0*  HGB 12.4* 11.8* 12.9*  HCT 36.7* 37.9* 40.6  PLT 248 325 336    Coag's No results for input(s): APTT, INR in the last 168 hours.  Sepsis Markers Recent Labs  Lab 04/26/17 0037 04/27/17 0404 04/28/17 0404  LATICACIDVEN 1.2  --   --   PROCALCITON 0.48 0.47 0.66    ABG Recent Labs  Lab 04/28/17 1044 04/29/17 0330 04/30/17 0417  PHART 7.421 7.447 7.339*  PCO2ART 48.9* 44.7 67.4*  PO2ART 56.0* 54.2* 55.9*    Liver Enzymes Recent Labs  Lab 04/26/17 0037  AST 19  ALT 19  ALKPHOS 53  BILITOT 1.5*  ALBUMIN 3.1*    Cardiac Enzymes Recent Labs  Lab 04/26/17 0037  TROPONINI <0.03    Glucose Recent Labs  Lab 05/01/17 1105 05/01/17 1555 05/01/17 2047 05/02/17 0003 05/02/17 0408 05/02/17 0816  GLUCAP 149* 136* 121* 132* 127* 124*    Imaging No results found.   STUDIES:  CT Chest 3/15 > Dense Confluent Bilateral airspace disease with air bronchograms is suggestive of severe pulmonary edema/ARDS. Diffuse pulmonary hemorrhage or infection could have similar pattern, no mediastinal lymphadenopathy,  no mass in the airway or lung  [place] abd 3/22>>>  CULTURES: Blood 3/16 >> Sputum 3/16 >> normal flora  Sputum 3/19: neg Sputum 3/22>>>  ANTIBIOTICS: Ceftriaxone 3/12 >3/15 Clindamycin 3/12 >3/15 Vancomycin 3/15 >> 04/28/2007 Cefepime 3/15 >> 316 Azithromycin 3/15 >> 316 Zosyn 04/26/2017>>3/22  SIGNIFICANT EVENTS: 3/12 > Presented to ED, Intubated  3/14 > Extubated  3/15 > Re-intubated  3/15 > Transferred to [facility]   LINES/TUBES: ETT 3/12 > 3/14 ETT 3/14 >>   DISCUSSION: 42 year old male who originally presented on 312 with fever sore throat and progressed to acute respiratory failure now ARDS.  He was placed on neuromuscular blockade on 3/17.  He is now 48 hours into  neuromuscular blockade.  His PEEP/FiO2 requirements have stabilized, but not changed much in comparison to late last afternoon.  Overall seems to be improving from a pulmonary standpoint did have an episode of vomiting last night.  Not sure where this is coming from.  His lipase is slightly elevated so he could have some mild pancreatitis this could certainly explain fevers and nausea.  From pulmonary standpoint we will continue to wean PEEP and FiO2.  I believe he can start pressure support trialing.  From a fever standpoint we will repeat sputum cultures, check abdominal ultrasound, and continue supportive care.  If stool continues high output consider cdiff testing     ASSESSMENT / PLAN:  Acute Hypoxic Respiratory Failure in setting of Severe ARDS secondary to presumed CAP, +Strep A with noted pharyngitis  H/O Tobacco Abuse  -CT scan consistent with ARDS -placed on NMB 3/17-->dc 3/19 -PCXR: improved bilateral aeration. ETT good position Plan Cont ards protocol Weaning PEEP/FIO2; start PSV cycling  Cont chest percussion Cont hypertonic nebs Completed 7d abx Hold lasix today PAD protocol-->RASS goal 0   Never fever and sl worsening of leukocytosis w/ mildly elevated lipase may be mild pancreatic inflammation but also r/o aspiration  -did have vomiting event 3/22.  -lipase slightly elevated.  -still having liq stools.  Plan Hold tubefeeds Stopping laxatives Check LFTs and also amylase abd [place] Hold off ON CT If stool output and leukocytosis persists will have to consider nosocomial infection   Presumed HSV blister right nare Plan Cont zovirax stated 3/19  Anemia of critical illness -No evidence of bleeding Plan Trend cbc and transfuse for hgb < 7  H/o DVT; CT chest neg  Plan [place] heparin   Fluid and electrolyte imbalance:hypernatremia   Plan Cont free water-->inc to q4   DVT prophylaxis: [facility] and PAS  SUP: PPI  Diet: tubefeeds-->holding  Activity: bedrest  Disposition  : ICU   Erick Colace ACNP-BC [place] Pager # [phone] OR # [phone] if no answer   FAMILY  - Updates: 03/30/2017 wife and other family was updated at bedside.  We will need to decrease outside stimulation so he can be easily sedated.  - Inter-disciplinary family meet or Palliative Care meeting due by:  3/23  My cct 39 min    Erick Colace ACNP-BC [place] Pager # [phone] OR # [phone] if no answer   Critical care attending attestation:  Patient seen and examined and I agree with the assessment as outlined above by Jerrye Bushy, NP.  The following issues were addressed on rounds today:  1. acute hypoxic respiratory failure requiring mechanical ventilation.  He has made significant progress over the last 2 days.  His PEEP has been successfully weaned down he passed SBT.  He has been  successfully extubated to nasal cannula. -Resolving respiratory failure.  - Progressive ambulation. -Progressive diet.  2.  Encephalopathy.  The patient is awake and follows commands but is somewhat somnolent.  He has received a fair amount of sedation that should clear up with time.  There is no focal deficits. -Minimize sedation.   Kipp Brood, MD [place] [facility].

## 2017-05-02 NOTE — Procedures (Signed)
Extubation Procedure Note  Patient Details:   Name: Richard Dyer DOB: [birth year] MRN: [id number]   Airway Documentation:     Evaluation  O2 sats: stable throughout Complications: No apparent complications Patient did tolerate procedure well. Bilateral Breath Sounds: Clear, Diminished   Yes   Pt extubated to 6L HFNC based on spo2 per MD order. Pt stable throughout with no complications. VS within normal limits. Positive cuff leak noted prior to extubation. No stridor noted, clear BS bilaterally throughout. Pt able to speak and has a strong productive cough post extubation. Pt encouraged to use [place] to clear secretions. RN at bedside. RT will continue to closely monitor   Jesse Sans 05/02/2017, 1:39 PM

## 2017-05-02 NOTE — Progress Notes (Signed)
Wasted 20cc of Versed and 25cc of Dilaudid in sink with Brien Mates, RN.

## 2017-05-03 ENCOUNTER — Inpatient Hospital Stay (HOSPITAL_COMMUNITY): Payer: 59

## 2017-05-03 ENCOUNTER — Encounter (HOSPITAL_COMMUNITY): Payer: Self-pay

## 2017-05-03 ENCOUNTER — Other Ambulatory Visit: Payer: Self-pay

## 2017-05-03 DIAGNOSIS — J189 Pneumonia, unspecified organism: Secondary | ICD-10-CM

## 2017-05-03 LAB — COMPREHENSIVE METABOLIC PANEL
ALBUMIN: 2.7 g/dL — AB (ref 3.5–5.0)
ALK PHOS: 68 U/L (ref 38–126)
ALT: 291 U/L — ABNORMAL HIGH (ref 17–63)
ANION GAP: 12 (ref 5–15)
AST: 158 U/L — ABNORMAL HIGH (ref 15–41)
BILIRUBIN TOTAL: 1.9 mg/dL — AB (ref 0.3–1.2)
BUN: 30 mg/dL — ABNORMAL HIGH (ref 6–20)
CALCIUM: 9.2 mg/dL (ref 8.9–10.3)
CO2: 23 mmol/L (ref 22–32)
Chloride: 112 mmol/L — ABNORMAL HIGH (ref 101–111)
Creatinine, Ser: 1.14 mg/dL (ref 0.61–1.24)
GFR calc Af Amer: >60 mL/min (ref >60)
GFR calc non Af Amer: >60 mL/min (ref >60)
GLUCOSE: 118 mg/dL — AB (ref 65–99)
Potassium: 3.2 mmol/L — ABNORMAL LOW (ref 3.5–5.1)
Sodium: 147 mmol/L — ABNORMAL HIGH (ref 135–145)
TOTAL PROTEIN: 7.4 g/dL (ref 6.5–8.1)

## 2017-05-03 LAB — GLUCOSE, CAPILLARY
GLUCOSE-CAPILLARY: 108 mg/dL — AB (ref 65–99)
Glucose-Capillary: 113 mg/dL — ABNORMAL HIGH (ref 65–99)
Glucose-Capillary: 159 mg/dL — ABNORMAL HIGH (ref 65–99)
Glucose-Capillary: 166 mg/dL — ABNORMAL HIGH (ref 65–99)
Glucose-Capillary: 124 mg/dL — ABNORMAL HIGH (ref 65–99)

## 2017-05-03 LAB — LIPASE, BLOOD: Lipase: 106 U/L — ABNORMAL HIGH (ref 11–51)

## 2017-05-03 LAB — TRIGLYCERIDES: Triglycerides: 237 mg/dL — ABNORMAL HIGH (ref <150)

## 2017-05-03 MED ORDER — POTASSIUM CHLORIDE 10 MEQ/50ML IV SOLN
10.0000 meq | INTRAVENOUS | Status: AC
  Administered 2017-05-03 (×4): 10 meq via INTRAVENOUS
  Filled 2017-05-03 (×4): qty 50

## 2017-05-03 NOTE — Progress Notes (Signed)
Pt removed Core Trak, which had been clamped, with no ill effect noted, will continue to monitor.

## 2017-05-03 NOTE — Progress Notes (Signed)
[facility] ADULT ICU REPLACEMENT PROTOCOL FOR AM LAB REPLACEMENT ONLY  The patient does apply for the [facility] Adult ICU Electrolyte Replacment Protocol based on the criteria listed below:   1. Is GFR >/= 40 ml/min? Yes.    Patient's GFR today is >60 2. Is urine output >/= 0.5 ml/kg/hr for the last 6 hours? Yes.   Patient's UOP is 1.1 ml/kg/hr 3. Is BUN < 60 mg/dL? Yes.    Patient's BUN today is 30 4. Abnormal electrolyte(s): K3.2 5. Ordered repletion with: Per protocol 6. If a panic level lab has been reported, has the CCM MD in charge been notified? Yes.  .   Physician:  J,Smith,MD  Vear Clock 05/03/2017 6:45 AM

## 2017-05-03 NOTE — Progress Notes (Addendum)
PULMONARY / CRITICAL CARE MEDICINE   Name: Richard Dyer MRN: [id number] DOB: 02/11/1976    ADMISSION DATE:  04/25/2017 CONSULTATION DATE:  04/25/2017  REFERRING MD:  [facility]   CHIEF COMPLAINT:  Hypoxic Respiratory Failure   HISTORY OF PRESENT ILLNESS:   42 year old male current smoker with PMH of remote DVT.  Transferred to [facility] on 3/15 for Severe ARDS secondary to presumed CAP, +Strep A with noted pharyngitis   Presented to ED on 3/12 with one day of fever, sore throat, dyspnea. In ED Tmax 102, rapid strep positive, flu negative. Family noted patient has had recent exposure to flu. CT neck with no peritonsillar abscess, however, patient stridorous. Intubated. Placed on ceftriaxone/clindamycin and dexamethasone. Extubated after 36 hours, however later that evening became hypoxic and tachypneic requiring re-intubation. CT chest with dense bilateral airspace disease with air bronchograms.     SUBJECTIVE:  Afebrile Some confusion overnight per wife Breathing better  VITAL SIGNS: BP 127/64 (BP Location: Left Arm)   Pulse 79   Temp 98.9 F (37.2 C) (Oral)   Resp (!) 36   Ht 5\' 10"  (1.778 m)   Wt 263 lb 3.7 oz (119.4 kg)   SpO2 95%   BMI 37.77 kg/m   HEMODYNAMICS:    VENTILATOR SETTINGS: Vent Mode: PRVC FiO2 (%):  [30 %] 30 % Set Rate:  [24 bmp] 24 bmp Vt Set:  [440 mL] 440 mL PEEP:  [8 cmH20] 8 cmH20 Plateau Pressure:  [14 cmH20] 14 cmH20  INTAKE / OUTPUT:  Intake/Output Summary (Last 24 hours) at 05/03/2017 0918 Last data filed at 05/03/2017 0800 Gross per 24 hour  Intake 470 ml  Output 1790 ml  Net -1320 ml     PHYSICAL EXAMINATION:  Gen. Pleasant, well-nourished, in no distress, normal affect ENT - no  stridor no post nasal drip Neck: No JVD, no thyromegaly, no carotid bruits Lungs: no use of accessory muscles, no dullness to percussion, clear without rales or rhonchi  Cardiovascular: Rhythm regular, heart sounds  normal, no murmurs or  gallops, no peripheral edema Abdomen: soft and non-tender, no hepatosplenomegaly, BS normal. Musculoskeletal: No deformities, no cyanosis or clubbing Neuro:  alert, awake,non focal , oriented to name, place, time - some confusion on address, other answers   LABS:  BMET Recent Labs  Lab 05/01/17 0500 05/02/17 0906 05/03/17 0506  NA 147* 148* 147*  K 3.9 3.8 3.2*  CL 102 108 112*  CO2 33* 26 23  BUN 31* 29* 30*  CREATININE 1.08 1.20 1.14  GLUCOSE 150* 132* 118*    Electrolytes Recent Labs  Lab 04/28/17 0404 04/29/17 0408 04/30/17 0450 05/01/17 0500 05/02/17 0906 05/03/17 0506  CALCIUM 8.4* 8.6* 8.4* 8.7* 9.7 9.2  MG 2.6* 2.8* 2.8*  --   --   --   PHOS 2.7 3.6 4.9*  --   --   --     CBC Recent Labs  Lab 04/28/17 0404 05/01/17 0500 05/02/17 0906  WBC 14.4* 14.0* 16.0*  HGB 12.4* 11.8* 12.9*  HCT 36.7* 37.9* 40.6  PLT 248 325 336    Coag's No results for input(s): APTT, INR in the last 168 hours.  Sepsis Markers Recent Labs  Lab 04/27/17 0404 04/28/17 0404  PROCALCITON 0.47 0.66    ABG Recent Labs  Lab 04/28/17 1044 04/29/17 0330 04/30/17 0417  PHART 7.421 7.447 7.339*  PCO2ART 48.9* 44.7 67.4*  PO2ART 56.0* 54.2* 55.9*    Liver Enzymes Recent Labs  Lab 05/02/17 0906  05/03/17 0506  AST 193* 158*  ALT 241* 291*  ALKPHOS 75 68  BILITOT 1.9* 1.9*  ALBUMIN 2.9* 2.7*    Cardiac Enzymes No results for input(s): TROPONINI, PROBNP in the last 168 hours.  Glucose Recent Labs  Lab 05/02/17 1202 05/02/17 1510 05/02/17 1957 05/02/17 2340 05/03/17 0359 05/03/17 0814  GLUCAP 126* 123* 111* 115* 113* 108*    Imaging US Abdomen Complete  Result Date: 05/02/2017 CLINICAL DATA:  Abnormal elevated lipase. Abnormal liver function tests. EXAM: ABDOMEN ULTRASOUND COMPLETE COMPARISON:  Chest CT 04/26/2017. FINDINGS: Gallbladder: No gallstones or wall thickening visualized. No sonographic Murphy sign noted by sonographer. Common bile duct:  Diameter: 2-3 mm common normal Liver: Echogenic somewhat heterogeneous liver consistent with steatosis. Echogenic area in the left lobe measuring 3.6 x 3.1 x 4.2 cm. This is nonspecific and could represent a hemangioma, an area of focally prominent fat, or a primary liver mass. Liver MRI suggested when able. Second abnormality in the anterior inferior right lobe measuring 1.5 cm in diameter consistent with a cyst. This was partially visible on a chest CT done last week. Portal vein is patent on color Doppler imaging with normal direction of blood flow towards the liver. IVC: No abnormality visualized. Pancreas: Visualized portion unremarkable. Spleen: Size and appearance within normal limits. Right Kidney: Length: 11.9 cm. Echogenicity within normal limits. No mass or hydronephrosis visualized. Left Kidney: Length: 10.9 cm. Lower pole not well seen because of bowel gas. Echogenicity within normal limits. No mass or hydronephrosis visualized. Abdominal aorta: Poorly seen because of overlying bowel gas. Other findings: No visible ascites. IMPRESSION: No pancreatic abnormality seen by ultrasound. Echogenic liver which is heterogeneous, consistent with steatosis. 3-4 cm echogenic area posteriorly within the left lobe which is nonspecific and could represent an area of more prominent focal fatty infiltration, hemangioma or other mass lesion. MRI of the liver suggested for further evaluation when the patient is able to tolerate that. Second hypoechoic focus in the inferior ventral right lobe the probably represents cyst. Electronically Signed   By: Nelson Chimes M.D.   On: 05/02/2017 16:29   Dg Chest Port 1 View  Result Date: 05/03/2017 CLINICAL DATA:  Pneumonia. EXAM: PORTABLE CHEST 1 VIEW COMPARISON:  05/01/2017. FINDINGS: The endotracheal tube has been removed. The feeding tube has also been removed. Stable enlarged cardiac silhouette and prominence of the pulmonary vasculature with scattered alveolar opacities  bilaterally. Interval small amount of atelectasis in the left perihilar region. Unremarkable bones. IMPRESSION: 1. Interval small amount of atelectasis in the left perihilar region. 2. Stable cardiomegaly and pulmonary edema. Electronically Signed   By: Claudie Revering M.D.   On: 05/03/2017 07:25     STUDIES:  CT Chest 3/15 > Dense Confluent Bilateral airspace disease with air bronchograms is suggestive of severe pulmonary edema/ARDS. Diffuse pulmonary hemorrhage or infection could have similar pattern, no mediastinal lymphadenopathy, no mass in the airway or lung  [place] abd 3/22>>>  CULTURES: Blood 3/16 >>ng Sputum 3/16 >> normal flora  Sputum 3/19: neg Sputum 3/22>>>ng  ANTIBIOTICS: Ceftriaxone 3/12 >3/15 Clindamycin 3/12 >3/15 Vancomycin 3/15 >> 04/28/2007 Cefepime 3/15 >> 316 Azithromycin 3/15 >> 316 Zosyn 04/26/2017>>3/22  SIGNIFICANT EVENTS: 3/12 > Presented to ED, Intubated  3/14 > Extubated  3/15 > Re-intubated  3/15 > Transferred to [facility]   LINES/TUBES: ETT 3/12 > 3/14 ETT 3/14 >> 3/22  DISCUSSION:  ARDS - resolved ? Aspiration vs CAP Expect rapid recovery    ASSESSMENT / PLAN:  Acute Hypoxic Respiratory  Failure in setting of Severe ARDS secondary to presumed CAP, +Strep A with noted pharyngitis  H/O Tobacco Abuse   Plan Mobilise, ambulate Completed ABx   Mild pancreatitis-lipase slightly elevated. -vomiting  3/22.   - liq stools.  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 FCCP. [place] Pager [phone] If no response call [phone]   05/03/2017

## 2017-05-03 NOTE — Progress Notes (Signed)
RT NOTE:  MetaNeb therapy completed. Pt preformed with great effort, tight seal, slow deep breaths and appropriate chest rise.   2 minutes CHFO alternating w/ 2 minutes CPEP therapy for 10 minutes total with resistance level of 2. Duoneb treatment delivered throughout.  Vitals stable throughout. SpO2 100%. Strong, prod cough following. Pt states " I feel like I ran a marathon".   CPT also delivered through bed following treatment.

## 2017-05-04 DIAGNOSIS — E662 Morbid (severe) obesity with alveolar hypoventilation: Secondary | ICD-10-CM

## 2017-05-04 DIAGNOSIS — J181 Lobar pneumonia, unspecified organism: Secondary | ICD-10-CM

## 2017-05-04 DIAGNOSIS — R748 Abnormal levels of other serum enzymes: Secondary | ICD-10-CM

## 2017-05-04 DIAGNOSIS — G4733 Obstructive sleep apnea (adult) (pediatric): Secondary | ICD-10-CM

## 2017-05-04 DIAGNOSIS — R160 Hepatomegaly, not elsewhere classified: Secondary | ICD-10-CM

## 2017-05-04 DIAGNOSIS — G9341 Metabolic encephalopathy: Secondary | ICD-10-CM

## 2017-05-04 LAB — COMPREHENSIVE METABOLIC PANEL
ALK PHOS: 64 U/L (ref 38–126)
ALT: 302 U/L — AB (ref 17–63)
AST: 118 U/L — AB (ref 15–41)
Albumin: 2.7 g/dL — ABNORMAL LOW (ref 3.5–5.0)
Anion gap: 13 (ref 5–15)
BUN: 23 mg/dL — AB (ref 6–20)
CALCIUM: 8.7 mg/dL — AB (ref 8.9–10.3)
CHLORIDE: 110 mmol/L (ref 101–111)
CO2: 22 mmol/L (ref 22–32)
Creatinine, Ser: 1 mg/dL (ref 0.61–1.24)
GFR calc non Af Amer: >60 mL/min (ref >60)
Glucose, Bld: 114 mg/dL — ABNORMAL HIGH (ref 65–99)
Potassium: 3.2 mmol/L — ABNORMAL LOW (ref 3.5–5.1)
SODIUM: 145 mmol/L (ref 135–145)
Total Bilirubin: 1.2 mg/dL (ref 0.3–1.2)
Total Protein: 7.2 g/dL (ref 6.5–8.1)

## 2017-05-04 LAB — CULTURE, RESPIRATORY W GRAM STAIN: Culture: NORMAL

## 2017-05-04 LAB — GLUCOSE, CAPILLARY
GLUCOSE-CAPILLARY: 111 mg/dL — AB (ref 65–99)
GLUCOSE-CAPILLARY: 131 mg/dL — AB (ref 65–99)
GLUCOSE-CAPILLARY: 156 mg/dL — AB (ref 65–99)
GLUCOSE-CAPILLARY: 184 mg/dL — AB (ref 65–99)
GLUCOSE-CAPILLARY: 97 mg/dL (ref 65–99)
Glucose-Capillary: 110 mg/dL — ABNORMAL HIGH (ref 65–99)
Glucose-Capillary: 131 mg/dL — ABNORMAL HIGH (ref 65–99)

## 2017-05-04 LAB — CULTURE, RESPIRATORY

## 2017-05-04 LAB — MAGNESIUM: Magnesium: 2.2 mg/dL (ref 1.7–2.4)

## 2017-05-04 LAB — LIPASE, BLOOD: Lipase: 142 U/L — ABNORMAL HIGH (ref 11–51)

## 2017-05-04 LAB — PHOSPHORUS: PHOSPHORUS: 3 mg/dL (ref 2.5–4.6)

## 2017-05-04 MED ORDER — MORPHINE SULFATE (PF) 2 MG/ML IV SOLN: 2.0000 mg | Freq: Once | INTRAVENOUS | Status: DC

## 2017-05-04 MED ORDER — ONDANSETRON HCL 4 MG/2ML IJ SOLN
4.0000 mg | Freq: Four times a day (QID) | INTRAMUSCULAR | Status: DC | PRN
  Administered 2017-05-04: 4 mg via INTRAVENOUS
  Filled 2017-05-04: qty 2

## 2017-05-04 MED ORDER — LORAZEPAM 1 MG PO TABS
1.0000 mg | ORAL_TABLET | Freq: Two times a day (BID) | ORAL | Status: DC | PRN
  Administered 2017-05-04 – 2017-05-05 (×2): 1 mg via ORAL
  Filled 2017-05-04 (×3): qty 1

## 2017-05-04 MED ORDER — PAROXETINE HCL 10 MG PO TABS
10.0000 mg | ORAL_TABLET | Freq: Every day | ORAL | Status: DC
  Administered 2017-05-05 – 2017-05-07 (×3): 10 mg via ORAL
  Filled 2017-05-04 (×4): qty 1

## 2017-05-04 MED ORDER — IPRATROPIUM-ALBUTEROL 0.5-2.5 (3) MG/3ML IN SOLN
3.0000 mL | Freq: Three times a day (TID) | RESPIRATORY_TRACT | Status: DC
  Administered 2017-05-05: 3 mL via RESPIRATORY_TRACT
  Filled 2017-05-04: qty 3

## 2017-05-04 MED ORDER — POTASSIUM CHLORIDE 20 MEQ/15ML (10%) PO SOLN
40.0000 meq | Freq: Once | ORAL | Status: AC
  Administered 2017-05-04: 40 meq via ORAL
  Filled 2017-05-04: qty 30

## 2017-05-04 NOTE — Progress Notes (Signed)
Dr. Sherral Hammers states it is okay to remove pt's central line.

## 2017-05-04 NOTE — Evaluation (Signed)
Physical Therapy Evaluation Patient Details Name: Richard Dyer MRN: [id number] DOB: 20-Aug-1975 Today's Date: 05/04/2017   History of Present Illness  42 year old male current smoker with PMH of remote DVT. Transferred to [facility] on 3/15 for Severe ARDS secondary to presumed CAP, +Strep A with noted pharyngitis; required intubation, extubated 3/22  Clinical Impression   Pt admitted with above diagnosis. Pt currently with functional limitations due to the deficits listed below (see PT Problem List). Independent prior to admission; Currently needs assistance for mobility/ADLs; Will work towards being ready to dc home when medically ready, and monitor progress;  Pt will benefit from skilled PT to increase their independence and safety with mobility to allow discharge to the venue listed below.       Follow Up Recommendations Home health PT;Supervision/Assistance - 24 hour;Other (comment)(will consider HHRN/Aide as well)    Equipment Recommendations  Rolling walker with 5" wheels;3in1 (PT)    Recommendations for Other Services OT consult(will order per protocol)     Precautions / Restrictions Precautions Precautions: Fall Restrictions Weight Bearing Restrictions: No      Mobility  Bed Mobility Overal bed mobility: Needs Assistance Bed Mobility: Supine to Sit     Supine to sit: Min assist     General bed mobility comments: Min handheld assist to pull to sit  Transfers Overall transfer level: Needs assistance Equipment used: Rolling walker (2 wheeled) Transfers: Sit to/from Stand Sit to Stand: Min assist         General transfer comment: Cues for hand placement and safety; min assist for steadiness/safety  Ambulation/Gait Ambulation/Gait assistance: Min assist;+2 safety/equipment Ambulation Distance (Feet): 100 Feet Assistive device: Rolling walker (2 wheeled) Gait Pattern/deviations: Step-through pattern Gait velocity: slowed   General Gait Details: Cues to  self-monitor for activity tolerance; Heavy dependence on RW, but good effort  Stairs            Wheelchair Mobility    Modified Rankin (Stroke Patients Only)       Balance Overall balance assessment: Needs assistance Sitting-balance support: Feet supported Sitting balance-Leahy Scale: Fair       Standing balance-Leahy Scale: Poor                               Pertinent Vitals/Pain Pain Assessment: No/denies pain    Home Living Family/patient expects to be discharged to:: Private residence Living Arrangements: Spouse/significant other Available Help at Discharge: Family;Available 24 hours/day(initially) Type of Home: House Home Access: Stairs to enter Entrance Stairs-Rails: Psychiatric nurse of Steps: 6 Home Layout: Two level Home Equipment: None      Prior Function Level of Independence: Independent               Hand Dominance        Extremity/Trunk Assessment   Upper Extremity Assessment Upper Extremity Assessment: Generalized weakness    Lower Extremity Assessment Lower Extremity Assessment: Generalized weakness       Communication   Communication: No difficulties  Cognition Arousal/Alertness: Awake/alert Behavior During Therapy: WFL for tasks assessed/performed Overall Cognitive Status: Within Functional Limits for tasks assessed                                        General Comments General comments (skin integrity, edema, etc.): Session conducted on 10 L supplemental O2 via high flow [place]; O2  sats ranged 93-96%; HR incr to 100 bpm    Exercises     Assessment/Plan    PT Assessment Patient needs continued PT services  PT Problem List Decreased strength;Decreased activity tolerance;Decreased balance;Decreased mobility;Decreased knowledge of use of DME;Decreased knowledge of precautions;Cardiopulmonary status limiting activity       PT Treatment Interventions DME instruction;Gait  training;Stair training;Functional mobility training;Therapeutic activities;Therapeutic exercise;Patient/family education;Balance training    PT Goals (Current goals can be found in the Care Plan section)  Acute Rehab PT Goals Patient Stated Goal: Home PT Goal Formulation: With patient Time For Goal Achievement: 05/25/17 Potential to Achieve Goals: Good    Frequency Min 3X/week   Barriers to discharge        Co-evaluation               AM-PAC PT "6 Clicks" Daily Activity  Outcome Measure Difficulty turning over in bed (including adjusting bedclothes, sheets and blankets)?: A Little Difficulty moving from lying on back to sitting on the side of the bed? : A Little Difficulty sitting down on and standing up from a chair with arms (e.g., wheelchair, bedside commode, etc,.)?: A Little Help needed moving to and from a bed to chair (including a wheelchair)?: A Lot Help needed walking in hospital room?: A Lot Help needed climbing 3-5 steps with a railing? : Total 6 Click Score: 14    End of Session Equipment Utilized During Treatment: Gait belt;Oxygen Activity Tolerance: Patient tolerated treatment well Patient left: in chair;with call bell/phone within reach Nurse Communication: Mobility status PT Visit Diagnosis: Unsteadiness on feet (R26.81);Muscle weakness (generalized) (M62.81)    Time: 3762-8315 PT Time Calculation (min) (ACUTE ONLY): 30 min   Charges:   PT Evaluation $PT Eval Moderate Complexity: 1 Mod PT Treatments $Gait Training: 8-22 mins   PT G Codes:        Roney Marion, PT  Acute Rehabilitation Services Pager [phone] Office [phone]   [place] 05/04/2017, 3:37 PM

## 2017-05-04 NOTE — Progress Notes (Addendum)
PROGRESS NOTE    Richard Dyer  WUJ:[id number] DOB: 05-30-1975 DOA: 04/25/2017 PCP: [name], NP   Brief Narrative:  42 year old WM PMHx Current smoker, remote DVT.  Transferred to [facility] on 3/15 for Severe ARDS secondary to presumed CAP, +Strep A with noted pharyngitis    Presented to ED on 3/12 with one day of fever, sore throat, dyspnea. In ED Tmax 102, rapid strep positive, flu negative. Family noted patient has had recent exposure to flu. CT neck with no peritonsillar abscess, however, patient stridorous. Intubated. Placed on ceftriaxone/clindamycin and dexamethasone. Extubated after 36 hours, however later that evening became hypoxic and tachypneic requiring re-intubation. CT chest with dense bilateral airspace disease with air bronchograms.       Subjective: 3/24 A/O x4, negative CP, positive S OB, negative abdominal pain.  Per wife patient has witnessed apneic spells when sleeping.  Has never had sleep study.   Assessment & Plan:   Active Problems:   Sepsis ([place])   Endotracheally intubated   ARDS (adult respiratory distress syndrome) (HCC)  Acute respiratory failure with hypoxia/ARDS/ CAP positive strep a with pharyngitis -Completed course of antibiotics -Titrate O2 to maintain SPO2> 93%  OSA/OHS -Patient will require appointment for sleep study upon discharge -On 3/25 contact trilogy representative, as patient will be best served by leaving hospital with equipment. -Patient with Mallampati score 3-4 -Patient will be difficult intubation.    Tobacco abuse -Counseled patient at length must absolutely abstain from tobacco use.  Pancreatitis - Abdominal ultrasound: Steatosis, echogenic lesion Recent Labs  Lab 05/02/17 0906 05/03/17 0506 05/04/17 0544  LIPASE 93* 106* 142*  -Worsening.   Liver lesion/mass? 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 Diffuse pulmonary hemorrhage or infection could have similar pattern, no mediastinal lymphadenopathy, no mass in the airway or lung  3/22 US abdomen:-Echogenic liver which is heterogeneous, consistent with steatosis. -3-4 cm echogenic area posteriorly within the left lobe which is nonspecific and could represent an area of more prominent focal fatty infiltration, hemangioma or other mass lesion.  -Second hypoechoic focus in the inferior ventral right lobe the probably represents cyst     I have personally reviewed and interpreted all radiology studies and my findings are as above.  VENTILATOR SETTINGS:    Cultures 3/15 respiratory virus panel: Negative 3/16 HIV negative Blood 3/16 >>ng Sputum 3/16 >> normal flora  Sputum 3/19: neg Sputum 3/22>>>ng   Antimicrobials: Anti-infectives (From admission, onward)   Start     Stop   04/26/17 0430  vancomycin (VANCOCIN) 1,250 mg in sodium chloride 0.9 % 250 mL IVPB  Status:  Discontinued     04/27/17 1253   04/26/17 0430  piperacillin-tazobactam (ZOSYN) IVPB 3.375 g     05/02/17 0232   04/26/17 0230  penicillin g benzathine (BICILLIN LA) 1200000  UNIT/2ML injection 1.2 Million Units     04/26/17 0410       Devices   LINES / TUBES:  ETT 3/12 > 3/14 ETT 3/14 >> 3/22     Continuous Infusions: . sodium chloride 10 mL/hr at 05/01/17 1800  . sodium chloride 10 mL/hr at  05/04/17 0808     Objective: Vitals:   05/04/17 0900 05/04/17 1000 05/04/17 1100 05/04/17 1225  BP: (!) 105/40 132/82 114/68   Pulse: 79 82 79   Resp:   (!) 28   Temp:    97.6 F (36.4 C)  TempSrc:    Oral  SpO2: 98% 95% 94%   Weight:      Height:        Intake/Output Summary (Last 24 hours) at 05/04/2017 1230 Last data filed at 05/04/2017 1200 Gross per 24 hour  Intake 300 ml  Output 445 ml  Net -145 ml   Filed Weights   05/02/17 0344 05/03/17 0434 05/04/17 0500  Weight: 272 lb 7.8 oz (123.6 kg) 263 lb 3.7 oz (119.4 kg) 267 lb 6.7 oz (121.3 kg)    Examination:  General: A/O x4, positive acute respiratory distress Eyes: negative scleral hemorrhage, negative anisocoria, negative icterus ENT: Negative Runny nose, negative gingival bleeding, Mallampati score= 4, bilateral tonsillar pillars erythematous Neck:  Negative scars, masses, torticollis, lymphadenopathy, JVD Lungs: Clear to auscultation bilaterally without wheezes or crackles Cardiovascular: Regular rate and rhythm without murmur gallop or rub normal S1 and S2 Abdomen: Morbidly obese, negativeabdominal pain, nondistended, positive soft, bowel sounds, no rebound, no ascites, no appreciable mass Extremities: No significant cyanosis, clubbing, or edema bilateral lower extremities Skin: Negative rashes, lesions, ulcers Psychiatric:  Negative depression, negative anxiety, negative fatigue, negative mania  Central nervous system:  Cranial nerves II through XII intact, tongue/uvula midline, all extremities muscle strength 5/5, sensation intact throughout,  negative dysarthria, negative expressive aphasia, negative receptive aphasia.  .     Data Reviewed: Care during the described time interval was provided by me .  I have reviewed this patient's available data, including medical history, events of note, physical examination, and all test results as part of my evaluation.   CBC: Recent Labs  Lab 04/28/17 0404  05/01/17 0500 05/02/17 0906  WBC 14.4* 14.0* 16.0*  NEUTROABS 11.1*  --  12.2*  HGB 12.4* 11.8* 12.9*  HCT 36.7* 37.9* 40.6  MCV 91.1 98.2 96.2  PLT 248 325 510   Basic Metabolic Panel: Recent Labs  Lab 04/28/17 0404 04/29/17 0408 04/30/17 0450 05/01/17 0500 05/02/17 0906 05/03/17 0506 05/04/17 0544  NA 140 145 148* 147* 148* 147* 145  K 3.3* 4.0 3.8 3.9 3.8 3.2* 3.2*  CL 103 104 104 102 108 112* 110  CO2 27 29 31  33* 26 23 22   GLUCOSE 136* 122* 163* 150* 132* 118* 114*  BUN 25* 30* 34* 31* 29* 30* 23*  CREATININE 0.92 1.06 1.18 1.08 1.20 1.14 1.00  CALCIUM 8.4* 8.6* 8.4* 8.7* 9.7 9.2 8.7*  MG 2.6* 2.8* 2.8*  --   --   --  2.2  PHOS 2.7 3.6 4.9*  --   --   --  3.0   GFR: Estimated Creatinine Clearance: 129.5 mL/min (by C-G formula based on SCr of 1 mg/dL). Liver Function Tests: Recent Labs  Lab 05/02/17 0906 05/03/17 0506 05/04/17 0544  AST 193* 158* 118*  ALT 241* 291* 302*  ALKPHOS 75 68 64  BILITOT 1.9* 1.9* 1.2  PROT 8.1 7.4 7.2  ALBUMIN 2.9*  2.7* 2.7*   Recent Labs  Lab 05/02/17 0906 05/03/17 0506 05/04/17 0544  LIPASE 93* 106* 142*  AMYLASE 211*  --   --    No results for input(s): AMMONIA in the last 168 hours. Coagulation Profile: No results for input(s): INR, PROTIME in the last 168 hours. Cardiac Enzymes: No results for input(s): CKTOTAL, CKMB, CKMBINDEX, TROPONINI in the last 168 hours. BNP (last 3 results) No results for input(s): PROBNP in the last 8760 hours. HbA1C: No results for input(s): HGBA1C in the last 72 hours. CBG: Recent Labs  Lab 05/03/17 1958 05/04/17 0003 05/04/17 0335 05/04/17 0840 05/04/17 1223  GLUCAP 124* 111* 184* 156* 131*   Lipid Profile: Recent Labs    05/02/17 0906 05/03/17 0506  TRIG 271* 237*   Thyroid Function Tests: No results for input(s): TSH, T4TOTAL, FREET4, T3FREE, THYROIDAB in the last 72 hours. Anemia Panel: No results for input(s): VITAMINB12, FOLATE, FERRITIN, TIBC, IRON, RETICCTPCT in  the last 72 hours. Urine analysis:    Component Value Date/Time   COLORURINE AMBER (A) 04/25/2017 0112   APPEARANCEUR CLEAR 04/25/2017 0112   LABSPEC 1.035 (H) 04/25/2017 0112   PHURINE 5.0 04/25/2017 0112   GLUCOSEU NEGATIVE 04/25/2017 0112   HGBUR MODERATE (A) 04/25/2017 0112   BILIRUBINUR NEGATIVE 04/25/2017 0112   KETONESUR NEGATIVE 04/25/2017 0112   PROTEINUR 100 (A) 04/25/2017 0112   NITRITE NEGATIVE 04/25/2017 0112   LEUKOCYTESUR NEGATIVE 04/25/2017 0112   Sepsis Labs: @LABRCNTIP (procalcitonin:4,lacticidven:4)  ) Recent Results (from the past 240 hour(s))  Respiratory Panel by PCR     Status: None   Collection Time: 04/25/17 11:39 PM  Result Value Ref Range Status   Adenovirus NOT DETECTED NOT DETECTED Final   Coronavirus 229E NOT DETECTED NOT DETECTED Final   Coronavirus HKU1 NOT DETECTED NOT DETECTED Final   Coronavirus NL63 NOT DETECTED NOT DETECTED Final   Coronavirus OC43 NOT DETECTED NOT DETECTED Final   Metapneumovirus NOT DETECTED NOT DETECTED Final   Rhinovirus / Enterovirus NOT DETECTED NOT DETECTED Final   Influenza A NOT DETECTED NOT DETECTED Final   Influenza B NOT DETECTED NOT DETECTED Final   Parainfluenza Virus 1 NOT DETECTED NOT DETECTED Final   Parainfluenza Virus 2 NOT DETECTED NOT DETECTED Final   Parainfluenza Virus 3 NOT DETECTED NOT DETECTED Final   Parainfluenza Virus 4 NOT DETECTED NOT DETECTED Final   Respiratory Syncytial Virus NOT DETECTED NOT DETECTED Final   Bordetella pertussis NOT DETECTED NOT DETECTED Final   Chlamydophila pneumoniae NOT DETECTED NOT DETECTED Final   Mycoplasma pneumoniae NOT DETECTED NOT DETECTED Final  MRSA PCR Screening     Status: None   Collection Time: 04/25/17 11:39 PM  Result Value Ref Range Status   MRSA by PCR NEGATIVE NEGATIVE Final    Comment:        The GeneXpert MRSA Assay (FDA approved for NASAL specimens only), is one component of a comprehensive MRSA colonization surveillance program. It is  not intended to diagnose MRSA infection nor to guide or monitor treatment for MRSA infections. Performed at [facility], [place] [address] [place], [place] [address]   Culture, blood (routine x 2)     Status: None   Collection Time: 04/26/17 12:48 AM  Result Value Ref Range Status   Specimen Description BLOOD RIGHT HAND  Final   Special Requests   Final    BOTTLES DRAWN AEROBIC AND ANAEROBIC Blood Culture adequate volume   Culture   Final    NO GROWTH  5 DAYS Performed at [facility], [place] [address], [place], [place] [address]    Report Status 05/01/2017 FINAL  Final  Culture, blood (routine x 2)     Status: None   Collection Time: 04/26/17 12:59 AM  Result Value Ref Range Status   Specimen Description BLOOD RIGHT WRIST  Final   Special Requests   Final    BOTTLES DRAWN AEROBIC AND ANAEROBIC Blood Culture adequate volume   Culture   Final    NO GROWTH 5 DAYS Performed at [facility], [place] [address], [place], [place] [address]    Report Status 05/01/2017 FINAL  Final  Culture, respiratory (NON-Expectorated)     Status: None   Collection Time: 04/26/17  7:50 AM  Result Value Ref Range Status   Specimen Description TRACHEAL ASPIRATE  Final   Special Requests NONE  Final   Gram Stain   Final    MODERATE WBC PRESENT, PREDOMINANTLY PMN RARE SQUAMOUS EPITHELIAL CELLS PRESENT FEW GRAM POSITIVE COCCI    Culture   Final    RARE Consistent with normal respiratory flora. Performed at [facility], [place] [address] [place], [place] [address]    Report Status 04/28/2017 FINAL  Final  Culture, respiratory (NON-Expectorated)     Status: None   Collection Time: 04/29/17  5:17 PM  Result Value Ref Range Status   Specimen Description TRACHEAL ASPIRATE  Final   Special Requests NONE  Final   Gram Stain   Final    RARE WBC PRESENT, PREDOMINANTLY PMN NO ORGANISMS SEEN    Culture   Final    NO GROWTH 2 DAYS Performed at [facility], [place] [address]  [place], [place] [address]    Report Status 05/02/2017 FINAL  Final  Culture, respiratory (NON-Expectorated)     Status: None (Preliminary result)   Collection Time: 05/02/17 11:51 AM  Result Value Ref Range Status   Specimen Description TRACHEAL ASPIRATE  Final   Special Requests NONE  Final   Gram Stain   Final    RARE WBC PRESENT, PREDOMINANTLY MONONUCLEAR NO ORGANISMS SEEN    Culture   Final    CULTURE REINCUBATED FOR BETTER GROWTH Performed at [facility], [place] [address], [place], [place] [address]    Report Status PENDING  Incomplete         Radiology Studies: US Abdomen Complete  Result Date: 05/02/2017 CLINICAL DATA:  Abnormal elevated lipase. Abnormal liver function tests. EXAM: ABDOMEN ULTRASOUND COMPLETE COMPARISON:  Chest CT 04/26/2017. FINDINGS: Gallbladder: No gallstones or wall thickening visualized. No sonographic Murphy sign noted by sonographer. Common bile duct: Diameter: 2-3 mm common normal Liver: Echogenic somewhat heterogeneous liver consistent with steatosis. Echogenic area in the left lobe measuring 3.6 x 3.1 x 4.2 cm. This is nonspecific and could represent a hemangioma, an area of focally prominent fat, or a primary liver mass. Liver MRI suggested when able. Second abnormality in the anterior inferior right lobe measuring 1.5 cm in diameter consistent with a cyst. This was partially visible on a chest CT done last week. Portal vein is patent on color Doppler imaging with normal direction of blood flow towards the liver. IVC: No abnormality visualized. Pancreas: Visualized portion unremarkable. Spleen: Size and appearance within normal limits. Right Kidney: Length: 11.9 cm. Echogenicity within normal limits. No mass or hydronephrosis visualized. Left Kidney: Length: 10.9 cm. Lower pole not well seen because of bowel gas. Echogenicity within normal limits. No mass or  hydronephrosis visualized. Abdominal aorta: Poorly seen because of overlying bowel gas. Other  findings: No visible ascites. IMPRESSION: No pancreatic abnormality seen by ultrasound. Echogenic liver which is heterogeneous, consistent with steatosis. 3-4 cm echogenic area posteriorly within the left lobe which is nonspecific and could represent an area of more prominent focal fatty infiltration, hemangioma or other mass lesion. MRI of the liver suggested for further evaluation when the patient is able to tolerate that. Second hypoechoic focus in the inferior ventral right lobe the probably represents cyst. Electronically Signed   By: Nelson Chimes M.D.   On: 05/02/2017 16:29   Dg Chest Port 1 View  Result Date: 05/03/2017 CLINICAL DATA:  Pneumonia. EXAM: PORTABLE CHEST 1 VIEW COMPARISON:  05/01/2017. FINDINGS: The endotracheal tube has been removed. The feeding tube has also been removed. Stable enlarged cardiac silhouette and prominence of the pulmonary vasculature with scattered alveolar opacities bilaterally. Interval small amount of atelectasis in the left perihilar region. Unremarkable bones. IMPRESSION: 1. Interval small amount of atelectasis in the left perihilar region. 2. Stable cardiomegaly and pulmonary edema. Electronically Signed   By: Claudie Revering M.D.   On: 05/03/2017 07:25        Scheduled Meds: . acyclovir ointment   Topical Q4H  . Chlorhexidine Gluconate Cloth  6 each Topical Daily  . enoxaparin (LOVENOX) injection  60 mg Subcutaneous Q24H  . ipratropium-albuterol  3 mL Nebulization Q6H  . sodium chloride flush  10-40 mL Intracatheter Q12H   Continuous Infusions: . sodium chloride 10 mL/hr at 05/01/17 1800  . sodium chloride 10 mL/hr at 05/04/17 0808     LOS: 9 days    Time spent: 40 minutes    Avien Taha, Geraldo Docker, MD [facility] Pager [phone]   If 7PM-7AM, please contact night-coverage [email or web address] Password [facility] 05/04/2017, 12:30 PM

## 2017-05-05 ENCOUNTER — Inpatient Hospital Stay (HOSPITAL_COMMUNITY): Payer: 59

## 2017-05-05 ENCOUNTER — Encounter (HOSPITAL_COMMUNITY): Payer: Self-pay | Admitting: Radiology

## 2017-05-05 LAB — CBC
HCT: 40.8 % (ref 39.0–52.0)
Hemoglobin: 13.6 g/dL (ref 13.0–17.0)
MCH: 31 pg (ref 26.0–34.0)
MCHC: 33.3 g/dL (ref 30.0–36.0)
MCV: 92.9 fL (ref 78.0–100.0)
PLATELETS: 335 10*3/uL (ref 150–400)
RBC: 4.39 MIL/uL (ref 4.22–5.81)
RDW: 13.7 % (ref 11.5–15.5)
WBC: 14.1 10*3/uL — AB (ref 4.0–10.5)

## 2017-05-05 LAB — COMPREHENSIVE METABOLIC PANEL
ALK PHOS: 63 U/L (ref 38–126)
ALT: 282 U/L — ABNORMAL HIGH (ref 17–63)
ANION GAP: 9 (ref 5–15)
AST: 81 U/L — ABNORMAL HIGH (ref 15–41)
Albumin: 2.7 g/dL — ABNORMAL LOW (ref 3.5–5.0)
BILIRUBIN TOTAL: 1.1 mg/dL (ref 0.3–1.2)
BUN: 20 mg/dL (ref 6–20)
CALCIUM: 8.6 mg/dL — AB (ref 8.9–10.3)
CO2: 22 mmol/L (ref 22–32)
Chloride: 109 mmol/L (ref 101–111)
Creatinine, Ser: 1 mg/dL (ref 0.61–1.24)
GFR calc non Af Amer: >60 mL/min (ref >60)
Glucose, Bld: 116 mg/dL — ABNORMAL HIGH (ref 65–99)
Potassium: 3.5 mmol/L (ref 3.5–5.1)
Sodium: 140 mmol/L (ref 135–145)
TOTAL PROTEIN: 7.3 g/dL (ref 6.5–8.1)

## 2017-05-05 LAB — LIPASE, BLOOD: Lipase: 142 U/L — ABNORMAL HIGH (ref 11–51)

## 2017-05-05 LAB — GLUCOSE, CAPILLARY
GLUCOSE-CAPILLARY: 104 mg/dL — AB (ref 65–99)
Glucose-Capillary: 111 mg/dL — ABNORMAL HIGH (ref 65–99)

## 2017-05-05 LAB — MAGNESIUM: MAGNESIUM: 2.3 mg/dL (ref 1.7–2.4)

## 2017-05-05 MED ORDER — ORAL CARE MOUTH RINSE
15.0000 mL | Freq: Two times a day (BID) | OROMUCOSAL | Status: DC
  Administered 2017-05-05 – 2017-05-07 (×5): 15 mL via OROMUCOSAL

## 2017-05-05 MED ORDER — IPRATROPIUM-ALBUTEROL 0.5-2.5 (3) MG/3ML IN SOLN
3.0000 mL | Freq: Two times a day (BID) | RESPIRATORY_TRACT | Status: DC
  Administered 2017-05-06 – 2017-05-07 (×3): 3 mL via RESPIRATORY_TRACT
  Filled 2017-05-05 (×3): qty 3

## 2017-05-05 MED ORDER — GADOBENATE DIMEGLUMINE 529 MG/ML IV SOLN
20.0000 mL | Freq: Once | INTRAVENOUS | Status: AC | PRN
  Administered 2017-05-05: 19 mL via INTRAVENOUS

## 2017-05-05 MED ORDER — ALBUTEROL SULFATE (2.5 MG/3ML) 0.083% IN NEBU: 2.5000 mg | INHALATION_SOLUTION | RESPIRATORY_TRACT | Status: DC | PRN

## 2017-05-05 MED ORDER — LORAZEPAM 2 MG/ML IJ SOLN
1.0000 mg | Freq: Once | INTRAMUSCULAR | Status: AC
  Administered 2017-05-05: 1 mg via INTRAVENOUS
  Filled 2017-05-05: qty 1

## 2017-05-05 NOTE — Progress Notes (Signed)
Family requesting follow up on social work for proof of admission for child support delay.  Fax number [phone].  Information relayed to 5W admitting nurse Toyin for follow-up.

## 2017-05-05 NOTE — Progress Notes (Signed)
Physical Therapy Treatment Patient Details Name: [facility] MRN: [id number] DOB: 04-03-1975 Today's Date: 05/05/2017    History of Present Illness 42 year old male current smoker with PMH of remote DVT. Transferred to [facility] on 3/15 for Severe ARDS secondary to presumed CAP, +Strep A with noted pharyngitis; required intubation, extubated 3/22    PT Comments    Pt admitted with above diagnosis. Pt currently with functional limitations due to balance and endurance deficits. Pt was able to ambulate on unit with RW with min assist.  Pt progressing.  Should do well.  Pt will benefit from skilled PT to increase their independence and safety with mobility to allow discharge to the venue listed below.     Follow Up Recommendations  Home health PT;Supervision/Assistance - 24 hour;Other (comment)(will consider HHRN/Aide as well)     Equipment Recommendations  Rolling walker with 5" wheels;3in1 (PT)    Recommendations for Other Services       Precautions / Restrictions Precautions Precautions: Fall Restrictions Weight Bearing Restrictions: No    Mobility  Bed Mobility Overal bed mobility: Needs Assistance             General bed mobility comments: Pt in chair position therefore progressively inclined bed to full chair position and pt exited from the end there.   Transfers Overall transfer level: Needs assistance Equipment used: Rolling walker (2 wheeled) Transfers: Sit to/from Stand Sit to Stand: Min assist;+2 safety/equipment         General transfer comment: Cues for hand placement and safety; min assist for steadiness/safety  Ambulation/Gait Ambulation/Gait assistance: Min assist;+2 safety/equipment Ambulation Distance (Feet): 280 Feet Assistive device: Rolling walker (2 wheeled) Gait Pattern/deviations: Step-through pattern;Decreased stride length;Drifts right/left Gait velocity: slowed Gait velocity interpretation: Below normal speed for age/gender General  Gait Details: Cues to self-monitor for activity tolerance; Pt needed cues to steer RW at times. Overall good safety awareness.  Did follow pt with chair however pt did not need to sit during the walking.    Stairs            Wheelchair Mobility    Modified Rankin (Stroke Patients Only)       Balance Overall balance assessment: Needs assistance Sitting-balance support: Feet supported;No upper extremity supported Sitting balance-Leahy Scale: Fair     Standing balance support: Bilateral upper extremity supported;During functional activity Standing balance-Leahy Scale: Poor Standing balance comment: relies on UE support for balance at present time .                            Cognition Arousal/Alertness: Awake/alert Behavior During Therapy: WFL for tasks assessed/performed Overall Cognitive Status: Within Functional Limits for tasks assessed                                        Exercises General Exercises - Lower Extremity Long Arc Quad: AROM;Both;10 reps;Seated    General Comments General comments (skin integrity, edema, etc.): Pt on 6LO2 with sats >92% throughout, Other VSS.       Pertinent Vitals/Pain Pain Assessment: No/denies pain   VSS on 6LO2.  Home Living                      Prior Function            PT Goals (current goals can now be found in  the care plan section) Acute Rehab PT Goals Patient Stated Goal: Home Progress towards PT goals: Progressing toward goals    Frequency    Min 3X/week      PT Plan Current plan remains appropriate    Co-evaluation PT/OT/SLP Co-Evaluation/Treatment: Yes Reason for Co-Treatment: Complexity of the patient's impairments (multi-system involvement);For patient/therapist safety PT goals addressed during session: Mobility/safety with mobility;Balance        AM-PAC PT "6 Clicks" Daily Activity  Outcome Measure  Difficulty turning over in bed (including adjusting  bedclothes, sheets and blankets)?: None Difficulty moving from lying on back to sitting on the side of the bed? : None Difficulty sitting down on and standing up from a chair with arms (e.g., wheelchair, bedside commode, etc,.)?: A Little Help needed moving to and from a bed to chair (including a wheelchair)?: A Little Help needed walking in hospital room?: A Little Help needed climbing 3-5 steps with a railing? : Total 6 Click Score: 18    End of Session Equipment Utilized During Treatment: Gait belt;Oxygen Activity Tolerance: Patient limited by fatigue Patient left: in chair;with call bell/phone within reach;with family/visitor present;with chair alarm set Nurse Communication: Mobility status PT Visit Diagnosis: Unsteadiness on feet (R26.81);Muscle weakness (generalized) (M62.81)     Time: 0175-1025 PT Time Calculation (min) (ACUTE ONLY): 23 min  Charges:  $Gait Training: 8-22 mins                    G Codes:       Niam Nepomuceno,PT Acute Rehabilitation [phone] (pager)    Denice Paradise 05/05/2017, 2:18 PM

## 2017-05-05 NOTE — Evaluation (Signed)
Occupational Therapy Evaluation Patient Details Name: [facility] MRN: [id number] DOB: 02-16-1975 Today's Date: 05/05/2017    History of Present Illness 42 year old male current smoker with PMH of remote DVT. [facility] for Severe ARDS secondary to presumed CAP, +Strep A with noted pharyngitis; required intubation, extubated 3/22   Clinical Impression   Pt was independent prior to admission. Presents with generalized weakness, decreased activity and impaired standing balance. Pt is able to perform ADL and ADL tranfers with min assist. He maintained 02 sats above 90% on 6L during exertion this visit. Pt is highly motivated to return home with assist of his wife. Will follow acutely.    Follow Up Recommendations  Home health OT    Equipment Recommendations  3 in 1 bedside commode    Recommendations for Other Services       Precautions / Restrictions Precautions Precautions: Fall Restrictions Weight Bearing Restrictions: No      Mobility Bed Mobility Overal bed mobility: Needs Assistance             General bed mobility comments: Pt in chair position therefore progressively inclined bed to full chair position and pt exited from the end there.   Transfers Overall transfer level: Needs assistance Equipment used: Rolling walker (2 wheeled) Transfers: Sit to/from Stand Sit to Stand: Min assist;+2 safety/equipment         General transfer comment: Cues for hand placement and safety; min assist for steadiness/safety    Balance Overall balance assessment: Needs assistance Sitting-balance support: Feet supported;No upper extremity supported Sitting balance-Leahy Scale: Fair     Standing balance support: Bilateral upper extremity supported;During functional activity Standing balance-Leahy Scale: Poor Standing balance comment: relies on UE support for balance at present time .                           ADL either performed or  assessed with clinical judgement   ADL Overall ADL's : Needs assistance/impaired Eating/Feeding: Independent;Sitting   Grooming: Set up;Cueing for UE precautions;Wash/dry hands   Upper Body Bathing: Set up;Sitting   Lower Body Bathing: Minimal assistance;Sit to/from stand   Upper Body Dressing : Minimal assistance;Sitting   Lower Body Dressing: Minimal assistance;Sit to/from stand   Toilet Transfer: Minimal assistance;Ambulation;RW;+2 for safety/equipment   Toileting- Clothing Manipulation and Hygiene: Minimal assistance;Sit to/from stand       Functional mobility during ADLs: Minimal assistance;+2 for safety/equipment;Rolling walker General ADL Comments: pt on 2L 02     Vision Patient Visual Report: No change from baseline       Perception     Praxis      Pertinent Vitals/Pain Pain Assessment: No/denies pain     Hand Dominance Right   Extremity/Trunk Assessment Upper Extremity Assessment Upper Extremity Assessment: Overall WFL for tasks assessed   Lower Extremity Assessment Lower Extremity Assessment: Defer to PT evaluation   Cervical / Trunk Assessment Cervical / Trunk Assessment: Normal   Communication Communication Communication: No difficulties   Cognition Arousal/Alertness: Awake/alert Behavior During Therapy: WFL for tasks assessed/performed Overall Cognitive Status: Within Functional Limits for tasks assessed                                     General Comments  Pt on 6LO2 with sats >92% throughout, Other VSS.     Exercises Exercises: General Lower Extremity General Exercises -  Lower Extremity Long Arc Quad: AROM;Both;10 reps;Seated   Shoulder Instructions      Home Living Family/patient expects to be discharged to:: Private residence Living Arrangements: Spouse/significant other Available Help at Discharge: Family;Available 24 hours/day Type of Home: House Home Access: Stairs to enter [employer] of Steps:  6 Entrance Stairs-Rails: Right;Left Home Layout: Two level Alternate Level Stairs-Number of Steps: flight Alternate Level Stairs-Rails: Right Bathroom Shower/Tub: Tub/shower unit(garden)   Bathroom Toilet: Standard     Home Equipment: None          Prior Functioning/Environment Level of Independence: Independent                 OT Problem List: Decreased activity tolerance;Impaired balance (sitting and/or standing);Decreased knowledge of use of DME or AE;Obesity;Cardiopulmonary status limiting activity      OT Treatment/Interventions: Self-care/ADL training;DME and/or AE instruction;Energy conservation;Patient/family education    OT Goals(Current goals can be found in the care plan section) Acute Rehab OT Goals Patient Stated Goal: Home OT Goal Formulation: With patient Time For Goal Achievement: 05/19/17 Potential to Achieve Goals: Good ADL Goals Pt Will Perform Grooming: with min guard assist;standing(2 activities) Pt Will Perform Lower Body Bathing: with min guard assist;sit to/from stand Pt Will Perform Lower Body Dressing: with min guard assist;sit to/from stand Pt Will Transfer to Toilet: with min guard assist;ambulating;regular height toilet Pt Will Perform Toileting - Clothing Manipulation and hygiene: with min guard assist;sit to/from stand Pt Will Perform Tub/Shower Transfer: with min assist;ambulating;3 in 1;rolling walker Additional ADL Goal #1: Pt wil initiate rest breaks as appropriate for energy conservation.  OT Frequency: Min 2X/week   Barriers to D/C:            Co-evaluation PT/OT/SLP Co-Evaluation/Treatment: Yes Reason for Co-Treatment: For patient/therapist safety PT goals addressed during session: Mobility/safety with mobility;Balance OT goals addressed during session: ADL's and self-care      AM-PAC PT "6 Clicks" Daily Activity     Outcome Measure Help from another person eating meals?: None Help from another person taking care of  personal grooming?: A Little Help from another person toileting, which includes using toliet, bedpan, or urinal?: A Little Help from another person bathing (including washing, rinsing, drying)?: A Little Help from another person to put on and taking off regular upper body clothing?: A Little Help from another person to put on and taking off regular lower body clothing?: A Little 6 Click Score: 19   End of Session Equipment Utilized During Treatment: Gait belt;Rolling walker;Oxygen Nurse Communication: Mobility status  Activity Tolerance: Patient tolerated treatment well Patient left: in chair;with call bell/phone within reach;with chair alarm set;with family/visitor present  OT Visit Diagnosis: Unsteadiness on feet (R26.81);Other abnormalities of gait and mobility (R26.89);Muscle weakness (generalized) (M62.81)                Time: 5956-3875 OT Time Calculation (min): 23 min Charges:  OT General Charges $OT Visit: 1 Visit OT Evaluation $OT Eval Moderate Complexity: 1 Mod G-Codes:     May 28, 2017 Nestor Lewandowsky, OTR/L Pager: [phone] Werner Lean, Haze Boyden 05-28-2017, 2:59 PM

## 2017-05-05 NOTE — Progress Notes (Signed)
Attempted report to [place].

## 2017-05-05 NOTE — Progress Notes (Signed)
[facility] [place] TEAM 1 - Stepdown/ICU TEAM  [facility]  QHU:[id number] DOB: 1976-01-15 DOA: 04/25/2017 PCP: [name], NP    Brief Narrative:  42yo M w/ Hx of tobacco use and remote DVT who was transferred to [facility] 3/15 forSevere ARDS secondary to Strep A PNA with pharyngitis.  He presented to ED on 3/12 with one day of fever, sore throat, dyspnea. In ED Tmax 102, rapid Strep positive, flu negative. CT neck with no peritonsillar abscess, however patient stridorous. Intubated. Placed on ceftriaxone/clindamycin and dexamethasone. Extubated after 36 hours, however later that evening became hypoxic and tachypneic requiring re-intubation. CT chest with dense bilateral airspace disease with air bronchograms.   Significant Events: 3/12 > Presented to ED, Intubated  3/14 > Extubated  3/15 > Re-intubated  3/15 > Transferred to [facility]  3/22 > Extubated   Subjective: C/o lots of airway congestion.  Denies cp, n/v, abdom pain, or sob.  Is anxious about forthcoming MRI abdom.    Assessment & Plan:  Acute respiratory failure with hypoxia / ARDS / Strep A PNA with pharyngitis Completed course of antibiotics  OSA/OHS will require appointment for sleep study as outpt   Tobacco abuse Dr. Sherral Hammers counseled patient at length must absolutely abstain from tobacco use  Mild Pancreatitis Abdominal US w/o abnormal pancreatic findings   Liver lesion? 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 rate 20, height 5\' 10"  (1.778 m), weight 116 kg (255 lb 11.7 oz), SpO2 94 %.  Intake/Output Summary (Last 24 hours) at 05/05/2017 1043 Last data filed at 05/05/2017 0905 Gross per 24 hour  Intake 80 ml  Output 900 ml  Net -820 ml   Filed Weights   05/03/17 0434 05/04/17 0500 05/05/17 0450  Weight: 119.4 kg (263 lb 3.7 oz) 121.3 kg (267 lb 6.7 oz) 116 kg (255 lb 11.7 oz)    Examination: General: No acute respiratory distress Lungs: Clear to auscultation bilaterally without wheezes or crackles Cardiovascular: Regular rate and rhythm without murmur gallop or rub normal S1 and S2 Abdomen: Nontender, nondistended, soft, bowel sounds positive, no rebound, no ascites, no appreciable mass Extremities: No significant cyanosis, clubbing, or edema bilateral lower extremities  CBC: Recent Labs  Lab 05/01/17 0500 05/02/17 0906 05/05/17 0457  WBC 14.0* 16.0* 14.1*  NEUTROABS  --  12.2*  --   HGB 11.8* 12.9* 13.6  HCT 37.9* 40.6 40.8  MCV 98.2 96.2 92.9  PLT 325 336 681   Basic Metabolic Panel: Recent Labs  Lab 04/29/17 0408 04/30/17 0450  05/03/17 0506 05/04/17 0544 05/05/17 0457  NA 145 148*   < > 147* 145 140  K 4.0 3.8   < > 3.2* 3.2* 3.5  CL 104 104   < > 112* 110 109  CO2 29 31   < > 23 22 22   GLUCOSE 122* 163*   < > 118* 114* 116*  BUN  30* 34*   < > 30* 23* 20  CREATININE 1.06 1.18   < > 1.14 1.00 1.00  CALCIUM 8.6* 8.4*   < > 9.2 8.7* 8.6*  MG 2.8* 2.8*  --   --  2.2 2.3  PHOS 3.6 4.9*  --   --  3.0  --    < > = values in this interval not displayed.   GFR: Estimated Creatinine Clearance: 126.5 mL/min (by C-G formula based on SCr of 1 mg/dL).  Liver Function Tests: Recent Labs  Lab 05/02/17 0906 05/03/17 0506 05/04/17 0544 05/05/17 0457  AST 193* 158* 118* 81*  ALT 241* 291* 302* 282*  ALKPHOS 75 68 64 63  BILITOT 1.9* 1.9* 1.2 1.1  PROT  8.1 7.4 7.2 7.3  ALBUMIN 2.9* 2.7* 2.7* 2.7*   Recent Labs  Lab 05/02/17 0906 05/03/17 0506 05/04/17 0544 05/05/17 0457  LIPASE 93* 106* 142* 142*  AMYLASE 211*  --   --   --     CBG: Recent Labs  Lab 05/04/17 1638 05/04/17 1925 05/04/17 2332 05/05/17 0345 05/05/17 0847  GLUCAP 110* 131* 97 111* 104*    Recent Results (from the past 240 hour(s))  Respiratory Panel by PCR     Status: None   Collection Time: 04/25/17 11:39 PM  Result Value Ref Range Status   Adenovirus NOT DETECTED NOT DETECTED Final   Coronavirus 229E NOT DETECTED NOT DETECTED Final   Coronavirus HKU1 NOT DETECTED NOT DETECTED Final   Coronavirus NL63 NOT DETECTED NOT DETECTED Final   Coronavirus OC43 NOT DETECTED NOT DETECTED Final   Metapneumovirus NOT DETECTED NOT DETECTED Final   Rhinovirus / Enterovirus NOT DETECTED NOT DETECTED Final   Influenza A NOT DETECTED NOT DETECTED Final   Influenza B NOT DETECTED NOT DETECTED Final   Parainfluenza Virus 1 NOT DETECTED NOT DETECTED Final   Parainfluenza Virus 2 NOT DETECTED NOT DETECTED Final   Parainfluenza Virus 3 NOT DETECTED NOT DETECTED Final   Parainfluenza Virus 4 NOT DETECTED NOT DETECTED Final   Respiratory Syncytial Virus NOT DETECTED NOT DETECTED Final   Bordetella pertussis NOT DETECTED NOT DETECTED Final   Chlamydophila pneumoniae NOT DETECTED NOT DETECTED Final   Mycoplasma pneumoniae NOT DETECTED NOT DETECTED Final  MRSA PCR Screening     Status: None   Collection Time: 04/25/17 11:39 PM  Result Value Ref Range Status   MRSA by PCR NEGATIVE NEGATIVE Final    Comment:        The GeneXpert MRSA Assay (FDA approved for NASAL specimens only), is one component of a comprehensive MRSA colonization surveillance program. It is not intended to diagnose MRSA infection nor to guide or monitor treatment for MRSA infections. Performed at [facility], [place] [address] [place], [place] [address]   Culture, blood (routine x 2)     Status:  None   Collection Time: 04/26/17 12:48 AM  Result Value Ref Range Status   Specimen Description BLOOD RIGHT HAND  Final   Special Requests   Final    BOTTLES DRAWN AEROBIC AND ANAEROBIC Blood Culture adequate volume   Culture   Final    NO GROWTH 5 DAYS Performed at [facility], [place] [address] [place], [place] [address]    Report Status 05/01/2017 FINAL  Final  Culture, blood (routine x 2)     Status: None   Collection Time: 04/26/17 12:59 AM  Result Value Ref Range Status   Specimen Description BLOOD RIGHT WRIST  Final   Special Requests   Final    BOTTLES DRAWN AEROBIC AND ANAEROBIC Blood Culture adequate volume   Culture   Final    NO GROWTH 5 DAYS Performed at [facility], [address], [place], [place] [address]    Report Status 05/01/2017 FINAL  Final  Culture, respiratory (NON-Expectorated)     Status: None   Collection Time: 04/26/17  7:50 AM  Result Value Ref Range Status   Specimen Description TRACHEAL ASPIRATE  Final   Special Requests NONE  Final   Gram Stain   Final    MODERATE WBC PRESENT, PREDOMINANTLY PMN RARE SQUAMOUS EPITHELIAL CELLS PRESENT FEW GRAM POSITIVE COCCI    Culture   Final    RARE Consistent with normal respiratory flora. Performed at [facility], [place] [address], [place], [place] [address]    Report Status 04/28/2017 FINAL  Final  Culture, respiratory (NON-Expectorated)     Status: None   Collection Time: 04/29/17  5:17 PM  Result Value Ref Range Status   Specimen Description TRACHEAL ASPIRATE  Final   Special Requests NONE  Final   Gram Stain   Final    RARE WBC PRESENT, PREDOMINANTLY PMN NO ORGANISMS SEEN    Culture   Final    NO GROWTH 2 DAYS Performed at [facility], [place] [address], [place], [place] [address]    Report Status 05/02/2017 FINAL  Final  Culture, respiratory (NON-Expectorated)     Status: None   Collection Time: 05/02/17 11:51 AM  Result Value Ref Range Status   Specimen Description  TRACHEAL ASPIRATE  Final   Special Requests NONE  Final   Gram Stain   Final    RARE WBC PRESENT, PREDOMINANTLY MONONUCLEAR NO ORGANISMS SEEN    Culture   Final    Consistent with normal respiratory flora. Performed at [facility], [place] [address] [place], [place] [address]    Report Status 05/04/2017 FINAL  Final     Scheduled Meds: . acyclovir ointment   Topical Q4H  . enoxaparin (LOVENOX) injection  60 mg Subcutaneous Q24H  . ipratropium-albuterol  3 mL Nebulization TID  . mouth rinse  15 mL Mouth Rinse BID  . PARoxetine  10 mg Oral Daily     LOS: 10 days   Cherene Altes, MD [facility] Office  [phone] Pager - Text Page per Amion as per below:  On-Call/Text Page:      [email or web address]      password [email or web address]  If 7PM-7AM, please contact night-coverage [email or web address] Password [facility] 05/05/2017, 10:43 AM

## 2017-05-05 NOTE — Progress Notes (Signed)
Transported patient and all belongings to [phone].  Wife at bedside.

## 2017-05-06 ENCOUNTER — Inpatient Hospital Stay (HOSPITAL_COMMUNITY): Payer: 59

## 2017-05-06 LAB — COMPREHENSIVE METABOLIC PANEL
ALBUMIN: 2.8 g/dL — AB (ref 3.5–5.0)
ALK PHOS: 62 U/L (ref 38–126)
ALT: 253 U/L — AB (ref 17–63)
AST: 67 U/L — AB (ref 15–41)
Anion gap: 10 (ref 5–15)
BILIRUBIN TOTAL: 1 mg/dL (ref 0.3–1.2)
BUN: 19 mg/dL (ref 6–20)
CO2: 23 mmol/L (ref 22–32)
CREATININE: 1.01 mg/dL (ref 0.61–1.24)
Calcium: 8.9 mg/dL (ref 8.9–10.3)
Chloride: 106 mmol/L (ref 101–111)
GFR calc Af Amer: >60 mL/min (ref >60)
GLUCOSE: 108 mg/dL — AB (ref 65–99)
POTASSIUM: 3.5 mmol/L (ref 3.5–5.1)
Sodium: 139 mmol/L (ref 135–145)
TOTAL PROTEIN: 7 g/dL (ref 6.5–8.1)

## 2017-05-06 LAB — LIPASE, BLOOD: Lipase: 114 U/L — ABNORMAL HIGH (ref 11–51)

## 2017-05-06 LAB — HEPATITIS PANEL, ACUTE
HCV Ab: <0.1 s/co ratio (ref 0.0–0.9)
HEP B S AG: NEGATIVE
Hep A IgM: NEGATIVE
Hep B C IgM: NEGATIVE

## 2017-05-06 LAB — CBC
HCT: 39.6 % (ref 39.0–52.0)
Hemoglobin: 13.7 g/dL (ref 13.0–17.0)
MCH: 31.2 pg (ref 26.0–34.0)
MCHC: 34.6 g/dL (ref 30.0–36.0)
MCV: 90.2 fL (ref 78.0–100.0)
PLATELETS: 324 10*3/uL (ref 150–400)
RBC: 4.39 MIL/uL (ref 4.22–5.81)
RDW: 13.2 % (ref 11.5–15.5)
WBC: 13.5 10*3/uL — ABNORMAL HIGH (ref 4.0–10.5)

## 2017-05-06 LAB — MAGNESIUM: Magnesium: 2.4 mg/dL (ref 1.7–2.4)

## 2017-05-06 MED ORDER — AMOXICILLIN-POT CLAVULANATE 875-125 MG PO TABS
1.0000 | ORAL_TABLET | Freq: Two times a day (BID) | ORAL | Status: DC
  Administered 2017-05-06 – 2017-05-07 (×3): 1 via ORAL
  Filled 2017-05-06 (×3): qty 1

## 2017-05-06 MED ORDER — POTASSIUM CHLORIDE CRYS ER 20 MEQ PO TBCR
40.0000 meq | EXTENDED_RELEASE_TABLET | Freq: Once | ORAL | Status: AC
  Administered 2017-05-06: 40 meq via ORAL
  Filled 2017-05-06: qty 2

## 2017-05-06 NOTE — Progress Notes (Signed)
CSW received consult that patient requests a letter to show he has been in the hospital. CSW placed letter on shadow chart.   Percell Locus Shalanda Brogden LCSW [phone]

## 2017-05-06 NOTE — Progress Notes (Signed)
Pt arrived floor in the presence of wife. Pt was assessed to be AXOx4. Vitals signs stable. Skin assessment completed. Cardiac Telemetry initiated. No complaints of pain. Will continue to monitor.

## 2017-05-06 NOTE — Progress Notes (Signed)
Pt had an 18 beats of V-tach. Pt is asymptomatic. NP on call notified. Will continue to monitor.

## 2017-05-06 NOTE — Progress Notes (Signed)
Nutrition Follow-up  DOCUMENTATION CODES:   Obesity unspecified  INTERVENTION:  Monitor for needs Provided education and answered questions for patient  NUTRITION DIAGNOSIS:   Increased nutrient needs related to acute illness as evidenced by estimated needs. -ongoing  GOAL:   Patient will meet greater than or equal to 90% of their needs  -progressing  MONITOR:   PO intake, Labs, I & O's, Weight trends, Skin  ASSESSMENT:   42 year old male with no PMH, who was admitted on 3/15 with severe ARDS, CAP, and strep A as a transfer from [facility]. Intubated at [place] prior to transfer.  Spoke with patient at bedside. He is eating well today, ate 100% of breakfast and every other meal. No complaints. Patient wanted to discuss healthy diet going home. RD discussed packing meals and warming them up while he is on the road (he works in [employer], [employer] and is often on the road.) Also discussed healthier options when eating out. 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 Answered all questions asked. No other needs identified at this time.  Labs reviewed:  CBGs 104-111 Lipase 114 AST 67, ALT 253  Medications reviewed  Diet Order:  Diet regular Room service appropriate? Yes; Fluid consistency: Thin  EDUCATION NEEDS:   No education needs have been identified at this time  Skin:  Skin Assessment: Reviewed RN Assessment  Last BM:  05/05/2017  Height:   Ht Readings from Last 1 Encounters:  05/05/17 5\' 9"  (1.753 m)    Weight:   Wt Readings from Last 1 Encounters:  05/05/17 255 lb 11.7 oz (116 kg)    Ideal Body Weight:  75.45 kg  BMI:  Body mass index is 37.77 kg/m.  Estimated Nutritional Needs:   Kcal:  7915-0569 calories  Protein:  92-115 grams  Fluid:  1.9-2.2L  Satira Anis. Barrie Sigmund, MS, RD LDN Inpatient Clinical Dietitian Pager [phone]

## 2017-05-06 NOTE — Progress Notes (Addendum)
Richard Dyer  WCH:[id number] DOB: 1975/12/31 DOA: 04/25/2017 PCP: [name], NP    Brief Narrative:  42yo M w/ Hx of tobacco use and remote DVT who was transferred to [facility] 3/15 forSevere ARDS secondary to Strep A PNA with pharyngitis.  He presented to ED on 3/12 with one day of fever, sore throat, dyspnea. In ED Tmax 102, rapid Strep positive, flu negative. CT neck with no peritonsillar abscess, however patient stridorous. Intubated. Placed on ceftriaxone/clindamycin and dexamethasone. Extubated after 36 hours, however later that evening became hypoxic and tachypneic requiring re-intubation. CT chest with dense bilateral airspace disease with air bronchograms.   Significant Events: 3/12 > Presented to ED, Intubated  3/14 > Extubated  3/15 > Re-intubated  3/15 > Transferred to [facility]  3/22 > Extubated   Subjective: He is breathing better, denies abdominal pain   Assessment & Plan:  Acute respiratory failure with hypoxia / ARDS / Strep A PNA with pharyngitis Will get follow chest x ray.  He received 5 days of zosyn. Will start augment for 2 more days.   [facility] will require appointment for sleep study as outpt   Tobacco abuse Dr. Sherral Hammers counseled patient at length must absolutely abstain from tobacco use  Mild Pancreatitis Abdominal US w/o abnormal pancreatic findings  Denies abdominal pain.   Liver lesion? - elevated liver enzymes  MRI liver showed liver cyst. Fatty liver. Counseling provided to patient regarding weight loss.  LFT trending down.  Hepatitis panel negative.   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 Had normal echo this admission./ check EKG. Replete k.   Acute metabolic encephalopathy Resolved  H/o DVT  Hypokalemia Replace to goal of 4.0 - Mg is normal   DVT prophylaxis: SQ heparin  Code Status: FULL CODE Family Communication: wife at bedside.  Disposition Plan: stable for  tele bed   Consultants:  PCCM  Antimicrobials:  Ceftriaxone 3/12 >3/15 Clindamycin 3/12 >3/15 Vancomycin 3/15 >> 3/17 Cefepime 3/15 >> 316 Azithromycin 3/15 >> 316 Zosyn 3/16 >>3/22  Objective: Blood pressure 120/72, pulse 82, temperature 98.4 F (36.9 C), temperature source Oral, resp. rate 20, height 5\' 9"  (1.753 m), weight 116 kg (255 lb 11.7 oz), SpO2 97 %.  Intake/Output Summary (Last 24 hours) at 05/06/2017 1652 Last data filed at 05/06/2017 1000 Gross per 24 hour  Intake 420 ml  Output 380 ml  Net 40 ml   Filed Weights   05/03/17 0434 05/04/17 0500 05/05/17 0450  Weight: 119.4 kg (263 lb 3.7 oz) 121.3 kg (267 lb 6.7 oz) 116 kg (255 lb 11.7 oz)    Examination: General: No acute distress.  Lungs: crackles bases.  Cardiovascular:  S 1, S 2 RRR Abdomen: BS present, soft, nt Extremities: no edema.  CBC: Recent Labs  Lab 05/02/17 0906 05/05/17 0457 05/06/17 0548  WBC 16.0* 14.1* 13.5*  NEUTROABS 12.2*  --   --   HGB 12.9* 13.6 13.7  HCT 40.6 40.8 39.6  MCV 96.2 92.9 90.2  PLT 336 335 353   Basic Metabolic Panel: Recent Labs  Lab 04/30/17 0450  05/04/17 0544 05/05/17 0457 05/06/17 0548  NA 148*   < > 145 140 139  K 3.8   < > 3.2* 3.5 3.5  CL 104   < > 110 109 106  CO2 31   < > 22 22 23   GLUCOSE 163*   < > 114* 116* 108*  BUN 34*   < >  23* 20 19  CREATININE 1.18   < > 1.00 1.00 1.01  CALCIUM 8.4*   < > 8.7* 8.6* 8.9  MG 2.8*  --  2.2 2.3 2.4  PHOS 4.9*  --  3.0  --   --    < > = values in this interval not displayed.   GFR: Estimated Creatinine Clearance: 123.3 mL/min (by C-G formula based on SCr of 1.01 mg/dL).  Liver Function Tests: Recent Labs  Lab 05/03/17 0506 05/04/17 0544 05/05/17 0457 05/06/17 0548  AST 158* 118* 81* 67*  ALT 291* 302* 282* 253*  ALKPHOS 68 64 63 62  BILITOT 1.9* 1.2 1.1 1.0  PROT 7.4 7.2 7.3 7.0  ALBUMIN 2.7* 2.7* 2.7* 2.8*   Recent Labs  Lab 05/02/17 0906 05/03/17 0506 05/04/17 0544 05/05/17 0457  05/06/17 0548  LIPASE 93* 106* 142* 142* 114*  AMYLASE 211*  --   --   --   --     CBG: Recent Labs  Lab 05/04/17 1638 05/04/17 1925 05/04/17 2332 05/05/17 0345 05/05/17 0847  GLUCAP 110* 131* 97 111* 104*    Recent Results (from the past 240 hour(s))  Culture, respiratory (NON-Expectorated)     Status: None   Collection Time: 04/29/17  5:17 PM  Result Value Ref Range Status   Specimen Description TRACHEAL ASPIRATE  Final   Special Requests NONE  Final   Gram Stain   Final    RARE WBC PRESENT, PREDOMINANTLY PMN NO ORGANISMS SEEN    Culture   Final    NO GROWTH 2 DAYS Performed at [facility], [place] [address], [place], [place] [address]    Report Status 05/02/2017 FINAL  Final  Culture, respiratory (NON-Expectorated)     Status: None   Collection Time: 05/02/17 11:51 AM  Result Value Ref Range Status   Specimen Description TRACHEAL ASPIRATE  Final   Special Requests NONE  Final   Gram Stain   Final    RARE WBC PRESENT, PREDOMINANTLY MONONUCLEAR NO ORGANISMS SEEN    Culture   Final    Consistent with normal respiratory flora. Performed at [facility], [place] [address], [place], [place] [address]    Report Status 05/04/2017 FINAL  Final     Scheduled Meds: . acyclovir ointment   Topical Q4H  . amoxicillin-clavulanate  1 tablet Oral Q12H  . enoxaparin (LOVENOX) injection  60 mg Subcutaneous Q24H  . ipratropium-albuterol  3 mL Nebulization BID  . mouth rinse  15 mL Mouth Rinse BID  . PARoxetine  10 mg Oral Daily     LOS: 11 days   Niel Hummer, MD.  [phone]  If 7PM-7AM, please contact night-coverage [email or web address] Password [email or web address] 05/06/2017, 4:52 PM

## 2017-05-07 LAB — COMPREHENSIVE METABOLIC PANEL
ALT: 243 U/L — AB (ref 17–63)
ANION GAP: 10 (ref 5–15)
AST: 75 U/L — ABNORMAL HIGH (ref 15–41)
Albumin: 3 g/dL — ABNORMAL LOW (ref 3.5–5.0)
Alkaline Phosphatase: 64 U/L (ref 38–126)
BUN: 17 mg/dL (ref 6–20)
CHLORIDE: 104 mmol/L (ref 101–111)
CO2: 24 mmol/L (ref 22–32)
Calcium: 8.9 mg/dL (ref 8.9–10.3)
Creatinine, Ser: 1.07 mg/dL (ref 0.61–1.24)
Glucose, Bld: 118 mg/dL — ABNORMAL HIGH (ref 65–99)
POTASSIUM: 3.9 mmol/L (ref 3.5–5.1)
SODIUM: 138 mmol/L (ref 135–145)
Total Bilirubin: 1 mg/dL (ref 0.3–1.2)
Total Protein: 7.1 g/dL (ref 6.5–8.1)

## 2017-05-07 LAB — CBC
HEMATOCRIT: 41.3 % (ref 39.0–52.0)
HEMOGLOBIN: 14 g/dL (ref 13.0–17.0)
MCH: 31.1 pg (ref 26.0–34.0)
MCHC: 33.9 g/dL (ref 30.0–36.0)
MCV: 91.8 fL (ref 78.0–100.0)
Platelets: 338 10*3/uL (ref 150–400)
RBC: 4.5 MIL/uL (ref 4.22–5.81)
RDW: 13.9 % (ref 11.5–15.5)
WBC: 12.2 10*3/uL — AB (ref 4.0–10.5)

## 2017-05-07 LAB — MAGNESIUM: MAGNESIUM: 2.4 mg/dL (ref 1.7–2.4)

## 2017-05-07 LAB — LIPASE, BLOOD: Lipase: 87 U/L — ABNORMAL HIGH (ref 11–51)

## 2017-05-07 MED ORDER — ACYCLOVIR 5 % EX OINT: TOPICAL_OINTMENT | CUTANEOUS | 0 refills | Status: DC

## 2017-05-07 MED ORDER — POTASSIUM CHLORIDE CRYS ER 20 MEQ PO TBCR
20.0000 meq | EXTENDED_RELEASE_TABLET | Freq: Once | ORAL | Status: AC
  Administered 2017-05-07: 20 meq via ORAL
  Filled 2017-05-07: qty 1

## 2017-05-07 MED ORDER — PAROXETINE HCL 10 MG PO TABS: 10.0000 mg | ORAL_TABLET | Freq: Every day | ORAL | 0 refills | Status: DC

## 2017-05-07 MED ORDER — IPRATROPIUM-ALBUTEROL 0.5-2.5 (3) MG/3ML IN SOLN: 3.0000 mL | Freq: Two times a day (BID) | RESPIRATORY_TRACT | 0 refills | Status: DC

## 2017-05-07 MED ORDER — AMOXICILLIN-POT CLAVULANATE 875-125 MG PO TABS: 1.0000 | ORAL_TABLET | Freq: Two times a day (BID) | ORAL | 0 refills | Status: DC

## 2017-05-07 NOTE — Plan of Care (Signed)
  Problem: Health Behavior/Discharge Planning: Goal: Ability to manage health-related needs will improve Outcome: Completed/Met   Problem: Clinical Measurements: Goal: Ability to maintain clinical measurements within normal limits will improve Outcome: Completed/Met Goal: Will remain free from infection Outcome: Completed/Met Goal: Diagnostic test results will improve Outcome: Completed/Met Goal: Respiratory complications will improve Outcome: Completed/Met Goal: Cardiovascular complication will be avoided Outcome: Completed/Met   Problem: Activity: Goal: Risk for activity intolerance will decrease Outcome: Completed/Met   Problem: Coping: Goal: Level of anxiety will decrease Outcome: Completed/Met

## 2017-05-07 NOTE — Discharge Summary (Addendum)
Physician Discharge Summary  Richard Dyer DGL:[id number] DOB: 1975/11/14 DOA: 04/25/2017  PCP: [name], NP  Admit date: 04/25/2017 Discharge date: 05/07/2017  Admitted From: Home  Disposition: Home   Recommendations for Outpatient Follow-up:  1. Follow up with PCP in 1-2 weeks 2. Please obtain BMP/CBC in one week 3. Needs liver US in 3 to 6 months.  4. Please repeat LFT in 1 week.  5.   Home Health: out patient PT.   Discharge Condition: stable.  CODE STATUS: full code.  Diet recommendation: Heart Healthy   Brief/Interim Summary: Brief Narrative:  42yo Mw/ Hxof tobacco use and remote DVT who was transferred to [facility] 3/15 forSevere ARDS secondary to Strep A PNA with pharyngitis.  He presented to ED on 3/12 with one day of fever, sore throat, dyspnea. In ED Tmax 102, rapid Strep positive, flu negative. CT neck with no peritonsillar abscess, however patient stridorous. Intubated. Placed on ceftriaxone/clindamycin and dexamethasone. Extubated after 36 hours, however later that evening became hypoxic and tachypneic requiring re-intubation. CT chest with dense bilateral airspace disease with air bronchograms.  Significant Events: 3/12 >Presented to ED, Intubated  3/14 >Extubated  3/15 >Re-intubated  3/15 >Transferred to [facility]  3/22 > Extubated   Subjective: He is feeling well, he is feeling back to baseline from cognition. He was very confused. He will follow up with his PCP, urgent care physician.  Denies chest pain he is breathing better   Assessment & Plan:  Acute respiratory failure with hypoxia / ARDS / Strep A PNA with pharyngitis Will get follow chest x ray.  He received 5 days of zosyn. Started  augment for 3 more days.  chest x ray improved.   Sepsis rule out.    [facility] will require appointment for sleep study as outpt   Tobacco abuse Dr. Sherral Hammers counseled patient at length must absolutely abstain from tobacco use  Mild  Pancreatitis Abdominal US w/o abnormal pancreatic findings  Denies abdominal pain.   Liver lesion? - elevated liver enzymes  MRI liver showed liver cyst. Fatty liver. Counseling provided to patient regarding weight loss.  LFT trending down.  Hepatitis panel negative.  Discussed with gastroenterology, component of shock liver. Needs follow up LFT. Also will need Liver US in 3 to 6 month  to follow on cyst.   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 Had normal echo this admission./EKG sinus rhythm. Potassium replaced. No further episodes.   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   amoxicillin-clavulanate 875-125 MG tablet Commonly known as:  AUGMENTIN Take 1 tablet by mouth every 12 (  twelve) hours.   ipratropium-albuterol 0.5-2.5 (3) MG/3ML Soln Commonly known as:  DUONEB Take 3 mLs by nebulization 2 (two) times daily.   PARoxetine 10 MG tablet Commonly known as:  PAXIL Take 1 tablet (10 mg total) by mouth daily. Start taking on:  05/08/2017       Allergies  Allergen Reactions  . 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 Lungs elsewhere clear. Heart is upper normal in size with pulmonary vascularity normal. No adenopathy. No bone lesions. IMPRESSION: Patchy airspace consolidation in the bases felt to represent pneumonia. Slightly less opacity compared to most recent study. Lungs elsewhere clear. Stable cardiac silhouette. Electronically Signed   By: Lowella Grip III M.D.   On: 05/06/2017 14:10   Ct Angio Chest Pe W Or Wo Contrast  Result Date: 04/26/2017 CLINICAL DATA:  Acute onset of hypoxic respiratory failure. EXAM: CT ANGIOGRAPHY CHEST WITH CONTRAST TECHNIQUE: Multidetector CT imaging of the chest was performed using the standard protocol during bolus administration of intravenous contrast. Multiplanar CT image reconstructions and MIPs were obtained to evaluate the vascular anatomy. CONTRAST:  193mL ISOVUE-370 IOPAMIDOL (ISOVUE-370) INJECTION 76% COMPARISON:  Chest radiograph performed earlier today at 12:00 a.m. FINDINGS: Cardiovascular: There is no evidence of central pulmonary embolus. Evaluation for pulmonary embolus is suboptimal in areas of airspace consolidation. The heart is borderline normal in size. The thoracic aorta is grossly unremarkable. The great vessels are within normal limits. Mediastinum/Nodes: The mediastinum is grossly unremarkable in appearance. No mediastinal lymphadenopathy is seen. Trace pericardial fluid remains within normal limits. The visualized portions of the thyroid gland are unremarkable. No axillary lymphadenopathy is seen. The patient's endotracheal tube is seen ending 4 cm above the carina. Lungs/Pleura: Diffuse bilateral airspace opacification is noted, with additional hazy airspace opacity in  the expanded portions of the lungs. This may reflect pulmonary edema or pneumonia. Given clinical presentation, ARDS is a concern. No definite pleural effusion or pneumothorax is seen. Upper Abdomen: The visualized portions of the liver and spleen are unremarkable. The visualized portions of the pancreas, gallbladder, adrenal glands and kidneys are within normal limits. An enteric tube is noted extending to the body of the stomach. Musculoskeletal: No acute osseous abnormalities are identified. The visualized musculature is unremarkable in appearance. Review of the MIP images confirms the above findings. IMPRESSION: 1. No evidence of central pulmonary embolus. 2. Diffuse bilateral airspace opacification, with additional hazy airspace opacity in the expanded portions of the lungs. This may reflect pulmonary edema or pneumonia. Given clinical presentation, ARDS is a concern. Electronically Signed   By: Garald Balding M.D.   On: 04/26/2017 03:30   US Abdomen Complete  Result Date: 05/02/2017 CLINICAL DATA:  Abnormal elevated lipase. Abnormal liver function tests. EXAM: ABDOMEN ULTRASOUND COMPLETE COMPARISON:  Chest CT 04/26/2017. FINDINGS: Gallbladder: No gallstones or wall thickening visualized. No sonographic Murphy sign noted by sonographer. Common bile duct: Diameter: 2-3 mm common normal Liver: Echogenic somewhat heterogeneous liver consistent with steatosis. Echogenic area in the left lobe measuring 3.6 x 3.1 x 4.2 cm. This is nonspecific and could represent a hemangioma,  an area of focally prominent fat, or a primary liver mass. Liver MRI suggested when able. Second abnormality in the anterior inferior right lobe measuring 1.5 cm in diameter consistent with a cyst. This was partially visible on a chest CT done last week. Portal vein is patent on color Doppler imaging with normal direction of blood flow towards the liver. IVC: No abnormality visualized. Pancreas: Visualized portion unremarkable. Spleen: Size  and appearance within normal limits. Right Kidney: Length: 11.9 cm. Echogenicity within normal limits. No mass or hydronephrosis visualized. Left Kidney: Length: 10.9 cm. Lower pole not well seen because of bowel gas. Echogenicity within normal limits. No mass or hydronephrosis visualized. Abdominal aorta: Poorly seen because of overlying bowel gas. Other findings: No visible ascites. IMPRESSION: No pancreatic abnormality seen by ultrasound. Echogenic liver which is heterogeneous, consistent with steatosis. 3-4 cm echogenic area posteriorly within the left lobe which is nonspecific and could represent an area of more prominent focal fatty infiltration, hemangioma or other mass lesion. MRI of the liver suggested for further evaluation when the patient is able to tolerate that. Second hypoechoic focus in the inferior ventral right lobe the probably represents cyst. Electronically Signed   By: Nelson Chimes M.D.   On: 05/02/2017 16:29   Mr Liver W Wo Contrast  Result Date: 05/05/2017 CLINICAL DATA:  Evaluate liver lesions seen on ultrasound. EXAM: MRI ABDOMEN WITHOUT AND WITH CONTRAST TECHNIQUE: Multiplanar multisequence MR imaging of the abdomen was performed both before and after the administration of intravenous contrast. CONTRAST:  44mL MULTIHANCE GADOBENATE DIMEGLUMINE 529 MG/ML IV SOLN COMPARISON:  Ultrasound 05/02/2017 FINDINGS: Examination is quite limited due to patient motion. Lower chest: Bilateral infiltrates and cardiac enlargement are again demonstrated. Hepatobiliary: Motion degraded examination. There appear to be patchy areas of geographic fatty infiltration. There is a 2 cm cyst noted in segment 4B adjacent to the gallbladder. No worrisome hepatic lesions or intrahepatic biliary dilatation. The gallbladder appears normal. Normal caliber and course of the common bile duct. Pancreas:  No obvious mass, inflammation or ductal dilatation. Spleen:  Normal size.  No focal lesions. Adrenals/Urinary Tract:  The adrenal glands and kidneys are unremarkable. Stomach/Bowel: Visualized portions within the abdomen are unremarkable. Vascular/Lymphatic: No pathologically enlarged lymph nodes identified. No abdominal aortic aneurysm demonstrated. Other:  No ascites or abdominal wall hernia. Musculoskeletal: No significant bony findings. IMPRESSION: 1. Very limited examination. 2. Suspect geographic fatty infiltration of the liver. 3. 2 cm cyst in segment 4B. 4. Bilateral infiltrates and cardiac enlargement. 5. No other significant abdominal findings. Electronically Signed   By: Marijo Sanes M.D.   On: 05/05/2017 15:04   Mr 3d Recon At Scanner  Result Date: 05/05/2017 CLINICAL DATA:  Evaluate liver lesions seen on ultrasound. EXAM: MRI ABDOMEN WITHOUT AND WITH CONTRAST TECHNIQUE: Multiplanar multisequence MR imaging of the abdomen was performed both before and after the administration of intravenous contrast. CONTRAST:  51mL MULTIHANCE GADOBENATE DIMEGLUMINE 529 MG/ML IV SOLN COMPARISON:  Ultrasound 05/02/2017 FINDINGS: Examination is quite limited due to patient motion. Lower chest: Bilateral infiltrates and cardiac enlargement are again demonstrated. Hepatobiliary: Motion degraded examination. There appear to be patchy areas of geographic fatty infiltration. There is a 2 cm cyst noted in segment 4B adjacent to the gallbladder. No worrisome hepatic lesions or intrahepatic biliary dilatation. The gallbladder appears normal. Normal caliber and course of the common bile duct. Pancreas:  No obvious mass, inflammation or ductal dilatation. Spleen:  Normal size.  No focal lesions. Adrenals/Urinary Tract: The adrenal glands and kidneys  are unremarkable. Stomach/Bowel: Visualized portions within the abdomen are unremarkable. Vascular/Lymphatic: No pathologically enlarged lymph nodes identified. No abdominal aortic aneurysm demonstrated. Other:  No ascites or abdominal wall hernia. Musculoskeletal: No significant bony findings.  IMPRESSION: 1. Very limited examination. 2. Suspect geographic fatty infiltration of the liver. 3. 2 cm cyst in segment 4B. 4. Bilateral infiltrates and cardiac enlargement. 5. No other significant abdominal findings. Electronically Signed   By: Marijo Sanes M.D.   On: 05/05/2017 15:04   Dg Chest Port 1 View  Result Date: 05/03/2017 CLINICAL DATA:  Pneumonia. EXAM: PORTABLE CHEST 1 VIEW COMPARISON:  05/01/2017. FINDINGS: The endotracheal tube has been removed. The feeding tube has also been removed. Stable enlarged cardiac silhouette and prominence of the pulmonary vasculature with scattered alveolar opacities bilaterally. Interval small amount of atelectasis in the left perihilar region. Unremarkable bones. IMPRESSION: 1. Interval small amount of atelectasis in the left perihilar region. 2. Stable cardiomegaly and pulmonary edema. Electronically Signed   By: Claudie Revering M.D.   On: 05/03/2017 07:25   Dg Chest Port 1 View  Result Date: 05/01/2017 CLINICAL DATA:  ARDS P EXAM: PORTABLE CHEST 1 VIEW COMPARISON:  04/30/2017. FINDINGS: Interim removal of NG tube and placement of feeding tube. Feeding tube tip below the hemidiaphragm. Endotracheal tube in stable position. Stable cardiomegaly. Persistent but partially cleared bilateral pulmonary infiltrates/edema. No pleural effusion or pneumothorax. IMPRESSION: 1. Interim removal of NG tube and placement of feeding tube. Feeding tube tip below left hemidiaphragm. Endotracheal tube in stable position. 2.  Persistent partially cleared bilateral from infiltrates/edema. 3.  Stable cardiomegaly. Electronically Signed   By: Marcello Moores  Register   On: 05/01/2017 10:40   Dg Chest Port 1 View  Result Date: 04/30/2017 CLINICAL DATA:  Hypoxia EXAM: PORTABLE CHEST 1 VIEW COMPARISON:  April 29, 2017 FINDINGS: Endotracheal tube tip is 3.2 cm above the carina. Nasogastric tube tip is at the junction of the left innominate vein and superior vena cava. Nasogastric tube tip and  side port are below the diaphragm. No pneumothorax. There remains patchy airspace opacity in the mid lower lung zones without appreciable change. No new opacity evident. Heart is mildly enlarged with pulmonary vascularity within normal limits. No adenopathy appreciable. No bone lesions. IMPRESSION: Patchy airspace opacity bilaterally. Suspect multifocal pneumonia, although there may be a degree of underlying pulmonary edema. Stable cardiomegaly. Tube and catheter positions are unchanged without pneumothorax. Note that there may also be a degree of underlying ARDS. Electronically Signed   By: Lowella Grip III M.D.   On: 04/30/2017 07:45   Dg Chest Port 1 View  Result Date: 04/29/2017 CLINICAL DATA:  Pneumonia. EXAM: PORTABLE CHEST 1 VIEW COMPARISON:  04/28/2017. FINDINGS: Endotracheal tube, NG tube, left IJ line in stable position. Cardiomegaly unchanged. Diffuse bilateral pulmonary infiltrates/edema unchanged. No prominent pleural effusion. Stable biapical pleural thickening. No pneumothorax. IMPRESSION: 1.  Lines and tubes in stable position. 2.  Cardiomegaly unchanged. 3. Persistent bilateral pulmonary infiltrates/edema unchanged. Chest is unchanged from prior exam. Electronically Signed   By: Marcello Moores  Register   On: 04/29/2017 07:18   Dg Chest Port 1 View  Result Date: 04/28/2017 CLINICAL DATA:  Hypoxia EXAM: PORTABLE CHEST 1 VIEW COMPARISON:  April 27, 2017 FINDINGS: Endotracheal tube tip is 5.0 cm above the carina. Central catheter tip is in the superior vena cava near the cavoatrial junction. Nasogastric tube tip and side port below the diaphragm. No pneumothorax. There is patchy airspace consolidation throughout both mid and lower lung zones with the  greatest degree of consolidation in the medial right base. There is felt to be a degree of underlying interstitial edema. Heart is mildly enlarged with pulmonary venous hypertension. No adenopathy. No bone lesions. IMPRESSION: Tube and catheter  positions as described without evident pneumothorax. Areas of airspace opacity bilaterally with consolidation noted in the bases, more on the right than on the left. There is also pulmonary vascular congestion with interstitial edema. Suspect a degree of congestive heart failure. The areas of airspace opacity may represent alveolar edema or pneumonia. Both entities may exist concurrently. The consolidation in the right base does appear more suspicious for pneumonia than alveolar edema. Appearance of the lungs is similar compared to 1 day prior. Electronically Signed   By: Lowella Grip III M.D.   On: 04/28/2017 08:24   Dg Chest Port 1 View  Result Date: 04/27/2017 CLINICAL DATA:  42 year old male central line placement. Respiratory failure, intubated. EXAM: PORTABLE CHEST 1 VIEW COMPARISON:  0455 hours today and earlier. FINDINGS: Portable AP supine view at 1245 hours. Left IJ approach central line placed, tip at the cavoatrial junction level. Stable endotracheal tube tip at the level the clavicles. Enteric tube courses to the abdomen, tip not included. Stable cardiac size and mediastinal contours. Widespread bilateral Patchy and confluent pulmonary opacity persists relatively sparing the left apex and the right lateral lung base. Increased dense retrocardiac opacity now obscuring a portion of the central diaphragm. Otherwise ventilation is stable from this morning. No pneumothorax.  No definite pleural effusion. IMPRESSION: 1. Left IJ central line placed with no adverse features. Tip at the cavoatrial junction level. 2.  Otherwise stable lines and tubes. 3. Increasing medial lower lobe collapse or consolidation. Otherwise stable widespread bilateral pulmonary opacity since this morning. Electronically Signed   By: Genevie Ann M.D.   On: 04/27/2017 13:37   Dg Chest Port 1 View  Result Date: 04/27/2017 CLINICAL DATA:  Patient with respiratory failure. EXAM: PORTABLE CHEST 1 VIEW COMPARISON:  Chest CT and  chest radiograph 04/26/2017. FINDINGS: ET tube terminates in the mid trachea. Enteric tube courses inferior to the diaphragm. Stable cardiomegaly. Slight interval increase in right-greater-than-left airspace opacities. No pleural effusion or pneumothorax. IMPRESSION: Slight interval increase in diffuse right-greater-than-left airspace opacities. Considerations include multifocal pneumonia or edema. ET tube mid trachea. Electronically Signed   By: Lovey Newcomer M.D.   On: 04/27/2017 07:44   Dg Chest Port 1 View  Result Date: 04/26/2017 CLINICAL DATA:  Endotracheal and orogastric tube placement EXAM: PORTABLE CHEST 1 VIEW COMPARISON:  None. FINDINGS: There is moderate cardiomegaly with multifocal bilateral airspace opacities. The endotracheal tube tip is at the level of the clavicular heads. Orogastric tube side port overlies the gastric body. IMPRESSION: 1. Endotracheal tube and orogastric tube in radiographically appropriate position. 2. Moderate cardiomegaly with multifocal bilateral airspace opacities which may indicate multifocal infection or pulmonary edema. Electronically Signed   By: Ulyses Jarred M.D.   On: 04/26/2017 00:25   Dg Abd Portable 1v  Result Date: 04/30/2017 CLINICAL DATA:  Status post feeding tube placement. EXAM: PORTABLE ABDOMEN - 1 VIEW COMPARISON:  None in PACs FINDINGS: The radiodense tipped feeding tube is coiled with in the stomach with the tip directed toward the pyloric region. Adequate slack is present which should allow the tip to be propelled into the duodenum. IMPRESSION: The tip of the feeding tube lies in the distal gastric body and is directed toward the pylorus. Electronically Signed   By: David  Martinique M.D.  On: 04/30/2017 16:43       Subjective: He is feeling better , dyspnea improved.   Discharge Exam: Vitals:   05/07/17 0607 05/07/17 0912  BP: 119/60   Pulse: 79   Resp: 20   Temp: 99.1 F (37.3 C)   SpO2: 96% 98%   Vitals:   05/06/17 1929 05/06/17  2205 05/07/17 0607 05/07/17 0912  BP:  113/62 119/60   Pulse:  91 79   Resp:  20 20   Temp:  98.8 F (37.1 C) 99.1 F (37.3 C)   TempSrc:  Oral Oral   SpO2: 96% 95% 96% 98%  Weight:      Height:        General: Pt is alert, awake, not in acute distress Cardiovascular: RRR, S1/S2 +, no rubs, no gallops Respiratory: CTA bilaterally, no wheezing, no rhonchi Abdominal: Soft, NT, ND, bowel sounds + Extremities: no edema, no cyanosis    The results of significant diagnostics from this hospitalization (including imaging, microbiology, ancillary and laboratory) are listed below for reference.     Microbiology: Recent Results (from the past 240 hour(s))  Culture, respiratory (NON-Expectorated)     Status: None   Collection Time: 04/29/17  5:17 PM  Result Value Ref Range Status   Specimen Description TRACHEAL ASPIRATE  Final   Special Requests NONE  Final   Gram Stain   Final    RARE WBC PRESENT, PREDOMINANTLY PMN NO ORGANISMS SEEN    Culture   Final    NO GROWTH 2 DAYS Performed at [facility], [address], [place], [place] [address]    Report Status 05/02/2017 FINAL  Final  Culture, respiratory (NON-Expectorated)     Status: None   Collection Time: 05/02/17 11:51 AM  Result Value Ref Range Status   Specimen Description TRACHEAL ASPIRATE  Final   Special Requests NONE  Final   Gram Stain   Final    RARE WBC PRESENT, PREDOMINANTLY MONONUCLEAR NO ORGANISMS SEEN    Culture   Final    Consistent with normal respiratory flora. Performed at [facility], [place] [address] [place], [place] [address]    Report Status 05/04/2017 FINAL  Final     Labs: BNP (last 3 results) No results for input(s): BNP in the last 8760 hours. Basic Metabolic Panel: Recent Labs  Lab 05/03/17 0506 05/04/17 0544 05/05/17 0457 05/06/17 0548 05/07/17 0551  NA 147* 145 140 139 138  K 3.2* 3.2* 3.5 3.5 3.9  CL 112* 110 109 106 104  CO2 23 22 22 23 24   GLUCOSE 118* 114* 116*  108* 118*  BUN 30* 23* 20 19 17   CREATININE 1.14 1.00 1.00 1.01 1.07  CALCIUM 9.2 8.7* 8.6* 8.9 8.9  MG  --  2.2 2.3 2.4 2.4  PHOS  --  3.0  --   --   --    Liver Function Tests: Recent Labs  Lab 05/03/17 0506 05/04/17 0544 05/05/17 0457 05/06/17 0548 05/07/17 0551  AST 158* 118* 81* 67* 75*  ALT 291* 302* 282* 253* 243*  ALKPHOS 68 64 63 62 64  BILITOT 1.9* 1.2 1.1 1.0 1.0  PROT 7.4 7.2 7.3 7.0 7.1  ALBUMIN 2.7* 2.7* 2.7* 2.8* 3.0*   Recent Labs  Lab 05/02/17 0906 05/03/17 0506 05/04/17 0544 05/05/17 0457 05/06/17 0548 05/07/17 0551  LIPASE 93* 106* 142* 142* 114* 87*  AMYLASE 211*  --   --   --   --   --  No results for input(s): AMMONIA in the last 168 hours. CBC: Recent Labs  Lab 05/01/17 0500 05/02/17 0906 05/05/17 0457 05/06/17 0548 05/07/17 0551  WBC 14.0* 16.0* 14.1* 13.5* 12.2*  NEUTROABS  --  12.2*  --   --   --   HGB 11.8* 12.9* 13.6 13.7 14.0  HCT 37.9* 40.6 40.8 39.6 41.3  MCV 98.2 96.2 92.9 90.2 91.8  PLT 325 336 335 324 338   Cardiac Enzymes: No results for input(s): CKTOTAL, CKMB, CKMBINDEX, TROPONINI in the last 168 hours. BNP: Invalid input(s): POCBNP CBG: Recent Labs  Lab 05/04/17 1638 05/04/17 1925 05/04/17 2332 05/05/17 0345 05/05/17 0847  GLUCAP 110* 131* 97 111* 104*   D-Dimer No results for input(s): DDIMER in the last 72 hours. Hgb A1c No results for input(s): HGBA1C in the last 72 hours. Lipid Profile No results for input(s): CHOL, HDL, LDLCALC, TRIG, CHOLHDL, LDLDIRECT in the last 72 hours. Thyroid function studies No results for input(s): TSH, T4TOTAL, T3FREE, THYROIDAB in the last 72 hours.  Invalid input(s): FREET3 Anemia work up No results for input(s): VITAMINB12, FOLATE, FERRITIN, TIBC, IRON, RETICCTPCT in the last 72 hours. Urinalysis    Component Value Date/Time   COLORURINE AMBER (A) 04/25/2017 0112   APPEARANCEUR CLEAR 04/25/2017 0112   LABSPEC 1.035 (H) 04/25/2017 0112   PHURINE 5.0 04/25/2017  0112   GLUCOSEU NEGATIVE 04/25/2017 0112   HGBUR MODERATE (A) 04/25/2017 0112   BILIRUBINUR NEGATIVE 04/25/2017 0112   KETONESUR NEGATIVE 04/25/2017 0112   PROTEINUR 100 (A) 04/25/2017 0112   NITRITE NEGATIVE 04/25/2017 0112   LEUKOCYTESUR NEGATIVE 04/25/2017 0112   Sepsis Labs Invalid input(s): PROCALCITONIN,  WBC,  LACTICIDVEN Microbiology Recent Results (from the past 240 hour(s))  Culture, respiratory (NON-Expectorated)     Status: None   Collection Time: 04/29/17  5:17 PM  Result Value Ref Range Status   Specimen Description TRACHEAL ASPIRATE  Final   Special Requests NONE  Final   Gram Stain   Final    RARE WBC PRESENT, PREDOMINANTLY PMN NO ORGANISMS SEEN    Culture   Final    NO GROWTH 2 DAYS Performed at [facility], [place] [address] [place], [place] [address]    Report Status 05/02/2017 FINAL  Final  Culture, respiratory (NON-Expectorated)     Status: None   Collection Time: 05/02/17 11:51 AM  Result Value Ref Range Status   Specimen Description TRACHEAL ASPIRATE  Final   Special Requests NONE  Final   Gram Stain   Final    RARE WBC PRESENT, PREDOMINANTLY MONONUCLEAR NO ORGANISMS SEEN    Culture   Final    Consistent with normal respiratory flora. Performed at [facility], [place] [address], [place], [place] [address]    Report Status 05/04/2017 FINAL  Final     Time coordinating discharge: Over 30 minutes  SIGNED:   Elmarie Shiley, MD  [facility] 05/07/2017, 10:47 AM Pager   If 7PM-7AM, please contact night-coverage [email or web address] Password [email or web address]

## 2017-05-07 NOTE — Progress Notes (Signed)
Occupational Therapy Treatment and Discharge Patient Details Name: Richard Dyer MRN: [id number] DOB: October 19, 1975 Today's Date: 05/07/2017    History of present illness 42 year old male current smoker with PMH of remote DVT. Transferred to [facility] on 3/15 for Severe ARDS secondary to presumed CAP, +Strep A with noted pharyngitis; required intubation, extubated 3/22   OT comments  Pt is functioning independently in ADL and mobility. Pt maintained 96% 02 on RA with ambulation in hall. No further OT needs.  Follow Up Recommendations  No OT follow up    Equipment Recommendations  None recommended by OT    Recommendations for Other Services      Precautions / Restrictions Precautions Precautions: None Restrictions Weight Bearing Restrictions: No       Mobility Bed Mobility Overal bed mobility: Independent(HOb up)                Transfers Overall transfer level: Independent Equipment used: None                  Balance Overall balance assessment: Independent   Sitting balance-Leahy Scale: Normal       Standing balance-Leahy Scale: Good                             ADL either performed or assessed with clinical judgement   ADL Overall ADL's : Independent                         Toilet Transfer: Independent   Toileting- Clothing Manipulation and Hygiene: Independent       Functional mobility during ADLs: Independent General ADL Comments: 96% on RA     Vision       Perception     Praxis      Cognition Arousal/Alertness: Awake/alert Behavior During Therapy: WFL for tasks assessed/performed Overall Cognitive Status: Within Functional Limits for tasks assessed                                          Exercises     Shoulder Instructions       General Comments      Pertinent Vitals/ Pain       Pain Assessment: No/denies pain  Home Living                                          Prior Functioning/Environment              Frequency           Progress Toward Goals  OT Goals(current goals can now be found in the care plan section)  Progress towards OT goals: Goals met/education completed, patient discharged from OT  Acute Rehab OT Goals Patient Stated Goal: Home  Plan All goals met and education completed, patient discharged from OT services;Discharge plan needs to be updated    Co-evaluation                 AM-PAC PT "6 Clicks" Daily Activity     Outcome Measure   Help from another person eating meals?: None Help from another person taking care of personal grooming?: None Help from another person toileting, which includes using toliet, bedpan, or urinal?: None  Help from another person bathing (including washing, rinsing, drying)?: None Help from another person to put on and taking off regular upper body clothing?: None Help from another person to put on and taking off regular lower body clothing?: None 6 Click Score: 24    End of Session    OT Visit Diagnosis: Unsteadiness on feet (R26.81);Other abnormalities of gait and mobility (R26.89);Muscle weakness (generalized) (M62.81)   Activity Tolerance Patient tolerated treatment well   Patient Left in chair;with call bell/phone within reach   Nurse Communication Mobility status        Time: 0932-1000 OT Time Calculation (min): 28 min  Charges: OT General Charges $OT Visit: 1 Visit OT Treatments $Self Care/Home Management : 23-37 mins  05/07/2017 Nestor Lewandowsky, OTR/L Pager: [phone] Werner Lean Haze Boyden 05/07/2017, 10:04 AM

## 2017-05-07 NOTE — Progress Notes (Signed)
Discharge instructions, RX's and follow up appt explained and provided to patient and wife. Neb machine delivered to room and sent home with patient. Outpatient physical therapy paper work faxed to [facility] per patient request. Patient left floor via wheelchair accompanied by staff. No c/o pain or shortness of breath at d/c.  Stpehen Petitjean, Tivis Ringer, RN

## 2017-05-07 NOTE — Progress Notes (Signed)
Physical Therapy Treatment Patient Details Name: Richard Dyer MRN: [id number] DOB: 1975-08-20 Today's Date: 05/07/2017    History of Present Illness 42 year old male current smoker with PMH of remote DVT. Transferred to [facility] on 3/15 for Severe ARDS secondary to presumed CAP, +Strep A with noted pharyngitis; required intubation, extubated 3/22    PT Comments    Today's skilled session focused on gait training and stair negotiation for safe d/c home. Pt able to ambulate 500 ft independently. Some DOE noted, however pt was able to continue conversing and SpO2 remained >93% on RA. At this time pt has achieved all acute rehab goals. As pt is progressing, d/c recommendations updated to outpatient PT to increase pt activity tolerance and develop walking program. Pt expected to d/c today.    Follow Up Recommendations  Outpatient PT     Equipment Recommendations       Recommendations for Other Services OT consult(will order per protocol)     Precautions / Restrictions Precautions Precautions: None Restrictions Weight Bearing Restrictions: No    Mobility  Bed Mobility Overal bed mobility: Independent             General bed mobility comments: In chair on arrival  Transfers Overall transfer level: Independent Equipment used: None Transfers: Sit to/from Stand Sit to Stand: Independent            Ambulation/Gait Ambulation/Gait assistance: Independent Ambulation Distance (Feet): 500 Feet Assistive device: None Gait Pattern/deviations: Step-through pattern;Decreased stride length   Gait velocity interpretation: at or above normal speed for age/gender General Gait Details: Pt ambulated 500 ft with no AD, and no assist. SpO2 remained >93% throughout activity.   Stairs Stairs: Yes   Stair Management: One rail Left;Alternating pattern;Forwards Number of Stairs: 12 General stair comments: Pt negotiated 1 flight of stairs with supervision for safety. No cues  required for technique.  Wheelchair Mobility    Modified Rankin (Stroke Patients Only)       Balance Overall balance assessment: Independent Sitting-balance support: Feet supported;No upper extremity supported Sitting balance-Leahy Scale: Normal     Standing balance support: Bilateral upper extremity supported;During functional activity Standing balance-Leahy Scale: Good                              Cognition Arousal/Alertness: Awake/alert Behavior During Therapy: WFL for tasks assessed/performed Overall Cognitive Status: Within Functional Limits for tasks assessed                                        Exercises      General Comments        Pertinent Vitals/Pain Pain Assessment: No/denies pain    Home Living                      Prior Function            PT Goals (current goals can now be found in the care plan section) Acute Rehab PT Goals Patient Stated Goal: Home PT Goal Formulation: With patient Time For Goal Achievement: 05/25/17 Potential to Achieve Goals: Good Progress towards PT goals: Progressing toward goals    Frequency    Min 3X/week      PT Plan Current plan remains appropriate    Co-evaluation              AM-PAC  PT "6 Clicks" Daily Activity  Outcome Measure  Difficulty turning over in bed (including adjusting bedclothes, sheets and blankets)?: None Difficulty moving from lying on back to sitting on the side of the bed? : None Difficulty sitting down on and standing up from a chair with arms (e.g., wheelchair, bedside commode, etc,.)?: None Help needed moving to and from a bed to chair (including a wheelchair)?: None Help needed walking in hospital room?: None Help needed climbing 3-5 steps with a railing? : A Little 6 Click Score: 23    End of Session Equipment Utilized During Treatment: Gait belt Activity Tolerance: Patient tolerated treatment well Patient left: in chair;with call  bell/phone within reach Nurse Communication: Mobility status PT Visit Diagnosis: Unsteadiness on feet (R26.81);Muscle weakness (generalized) (M62.81)     Time: 7615-1834 PT Time Calculation (min) (ACUTE ONLY): 12 min  Charges:  $Gait Training: 8-22 mins                    G Codes:      Benjiman Core, [place] Pager [phone] Acute Rehab   Allena Katz 05/07/2017, 11:12 AM

## 2017-08-11 HISTORY — PX: EXPLORATION POST OPERATIVE OPEN HEART: SHX5061

## 2017-10-02 HISTORY — PX: TONSILLECTOMY: SUR1361

## 2019-03-04 ENCOUNTER — Emergency Department (HOSPITAL_COMMUNITY)
Admission: EM | Admit: 2019-03-04 | Discharge: 2019-03-04 | Disposition: A | Discharge status: Home / Self Care | Payer: BC Managed Care – PPO | Attending: Emergency Medicine | Admitting: Emergency Medicine

## 2019-03-04 ENCOUNTER — Other Ambulatory Visit: Payer: Self-pay

## 2019-03-04 ENCOUNTER — Encounter (HOSPITAL_COMMUNITY): Payer: Self-pay | Admitting: Emergency Medicine

## 2019-03-04 DIAGNOSIS — R519 Headache, unspecified: Secondary | ICD-10-CM | POA: Diagnosis not present

## 2019-03-04 DIAGNOSIS — T7840XA Allergy, unspecified, initial encounter: Secondary | ICD-10-CM | POA: Insufficient documentation

## 2019-03-04 DIAGNOSIS — L509 Urticaria, unspecified: Secondary | ICD-10-CM | POA: Diagnosis present

## 2019-03-04 DIAGNOSIS — Z79899 Other long term (current) drug therapy: Secondary | ICD-10-CM | POA: Diagnosis not present

## 2019-03-04 DIAGNOSIS — Z87891 Personal history of nicotine dependence: Secondary | ICD-10-CM | POA: Insufficient documentation

## 2019-03-04 LAB — CBC WITH DIFFERENTIAL/PLATELET
Abs Immature Granulocytes: 0.05 10*3/uL (ref 0.00–0.07)
Basophils Absolute: 0.1 10*3/uL (ref 0.0–0.1)
Basophils Relative: 0 %
Eosinophils Absolute: 0.1 10*3/uL (ref 0.0–0.5)
Eosinophils Relative: 1 %
HCT: 44.4 % (ref 39.0–52.0)
Hemoglobin: 15.4 g/dL (ref 13.0–17.0)
Immature Granulocytes: 0 %
Lymphocytes Relative: 13 %
Lymphs Abs: 1.5 10*3/uL (ref 0.7–4.0)
MCH: 30.6 pg (ref 26.0–34.0)
MCHC: 34.7 g/dL (ref 30.0–36.0)
MCV: 88.1 fL (ref 80.0–100.0)
Monocytes Absolute: 0.8 10*3/uL (ref 0.1–1.0)
Monocytes Relative: 7 %
Neutro Abs: 8.7 10*3/uL — ABNORMAL HIGH (ref 1.7–7.7)
Neutrophils Relative %: 79 %
Platelets: 257 10*3/uL (ref 150–400)
RBC: 5.04 MIL/uL (ref 4.22–5.81)
RDW: 13.7 % (ref 11.5–15.5)
WBC: 11.2 10*3/uL — ABNORMAL HIGH (ref 4.0–10.5)
nRBC: 0 % (ref 0.0–0.2)

## 2019-03-04 LAB — BASIC METABOLIC PANEL
Anion gap: 10 (ref 5–15)
BUN: 12 mg/dL (ref 6–20)
CO2: 24 mmol/L (ref 22–32)
Calcium: 8.7 mg/dL — ABNORMAL LOW (ref 8.9–10.3)
Chloride: 105 mmol/L (ref 98–111)
Creatinine, Ser: 1.14 mg/dL (ref 0.61–1.24)
GFR calc Af Amer: >60 mL/min (ref >60)
GFR calc non Af Amer: >60 mL/min (ref >60)
Glucose, Bld: 103 mg/dL — ABNORMAL HIGH (ref 70–99)
Potassium: 3.8 mmol/L (ref 3.5–5.1)
Sodium: 139 mmol/L (ref 135–145)

## 2019-03-04 MED ORDER — METOCLOPRAMIDE HCL 5 MG/ML IJ SOLN
10.0000 mg | Freq: Once | INTRAMUSCULAR | Status: AC
Start: 2019-03-04 — End: 2019-03-04
  Administered 2019-03-04: 10 mg via INTRAVENOUS
  Filled 2019-03-04: qty 2

## 2019-03-04 MED ORDER — PREDNISONE 20 MG PO TABS
60.0000 mg | ORAL_TABLET | Freq: Once | ORAL | Status: AC
  Administered 2019-03-04: 60 mg via ORAL
  Filled 2019-03-04: qty 3

## 2019-03-04 MED ORDER — FAMOTIDINE IN NACL 20-0.9 MG/50ML-% IV SOLN
20.0000 mg | Freq: Once | INTRAVENOUS | Status: AC
  Administered 2019-03-04: 20 mg via INTRAVENOUS
  Filled 2019-03-04: qty 50

## 2019-03-04 MED ORDER — ACETAMINOPHEN 325 MG PO TABS
650.0000 mg | ORAL_TABLET | Freq: Once | ORAL | Status: AC
Start: 2019-03-04 — End: 2019-03-04
  Administered 2019-03-04: 650 mg via ORAL
  Filled 2019-03-04: qty 2

## 2019-03-04 MED ORDER — SODIUM CHLORIDE 0.9 % IV BOLUS
1000.0000 mL | Freq: Once | INTRAVENOUS | Status: AC
  Administered 2019-03-04: 1000 mL via INTRAVENOUS

## 2019-03-04 NOTE — Discharge Instructions (Signed)
May take Benadryl 25-50 mg every 6 hours for the next 3 days as needed.  If you have any difficulty breathing please return immediately to the ED.

## 2019-03-04 NOTE — ED Provider Notes (Signed)
[facility] EMERGENCY DEPARTMENT Provider Note   CSN: [id number] Arrival date & time: 03/04/19  [place]     History Chief Complaint  Patient presents with  . Allergic Reaction    Richard Dyer is a 44 y.o. male.  HPI  Patient is 44 year old male presented today for rash to his chest and bilateral arms along with headache.  Patient states that he was given good vaccine yesterday.   Patient states that today he noticed itchy splotchy rash to his anterior chest and bilateral arms.  Denies any difficulty breathing but states that he had a sensation that his heart was rushing.  Patient states that he had no chest pain nausea, vomiting, GI upset, diarrhea, wheezing.  Has no history of anaphylaxis.  Only significant past medical history is ARDS years ago.  Patient states he has some lung damage residual from that and that his O2 sats usually however in the mid to low 90s.  Patient also endorses some fatigue and headache that he states began several hours after his vaccine yesterday.  Patient has no known drug triggers.  Has not had a urticarial reaction in the past.  No known allergies.  No new exposures that he knows of.  No new animals or detergents in the house.  No new foods.     History reviewed. No pertinent past medical history.  Patient Active Problem List   Diagnosis Date Noted  . Endotracheally intubated   . ARDS (adult respiratory distress syndrome) ([place])   . Sepsis ([place]) 04/25/2017    No past surgical history on file.     No family history on file.  Social History   Tobacco Use  . Smoking status: Former Smoker    Quit date: 04/22/2017    Years since quitting: 1.8  . Smokeless tobacco: Never Used  Substance Use Topics  . Alcohol use: Not Currently  . Drug use: Not Currently    Home Medications Prior to Admission medications   Medication Sig Start Date End Date Taking? Authorizing Provider  acyclovir ointment (ZOVIRAX) 5 % Apply topically  every 4 (four) hours. 05/07/17   Regalado, Belkys A, MD  amoxicillin-clavulanate (AUGMENTIN) 875-125 MG tablet Take 1 tablet by mouth every 12 (twelve) hours. 05/07/17   Regalado, Belkys A, MD  ipratropium-albuterol (DUONEB) 0.5-2.5 (3) MG/3ML SOLN Take 3 mLs by nebulization 2 (two) times daily. 05/07/17   Regalado, Belkys A, MD  PARoxetine (PAXIL) 10 MG tablet Take 1 tablet (10 mg total) by mouth daily. 05/08/17   Regalado, Jerald Kief A, MD    Allergies    Vancomycin  Review of Systems   Review of Systems  Constitutional: Positive for fatigue. Negative for chills and fever.  HENT: Negative for congestion.   Eyes: Negative for pain.  Respiratory: Negative for cough and shortness of breath.   Cardiovascular: Negative for chest pain and leg swelling.  Gastrointestinal: Negative for abdominal pain and vomiting.  Genitourinary: Negative for dysuria.  Musculoskeletal: Negative for myalgias.  Skin: Positive for rash.  Neurological: Positive for headaches. Negative for dizziness.    Physical Exam Updated Vital Signs BP 113/72 (BP Location: Left Arm)   Pulse 85   Temp 98.5 F (36.9 C) (Oral)   Resp 18   SpO2 95%   Physical Exam Vitals and nursing note reviewed.  Constitutional:      General: He is not in acute distress.    Comments: Pleasant 44 year old male appears stated age in no acute distress no difficulty breathing speaking  full sentences following commands answering questions  HENT:     Head: Normocephalic and atraumatic.     Nose: Nose normal.  Eyes:     General: No scleral icterus. Cardiovascular:     Rate and Rhythm: Normal rate and regular rhythm.     Pulses: Normal pulses.     Heart sounds: Normal heart sounds.  Pulmonary:     Effort: Pulmonary effort is normal. No respiratory distress.     Breath sounds: No wheezing.  Abdominal:     Palpations: Abdomen is soft.     Tenderness: There is no abdominal tenderness.  Musculoskeletal:     Cervical back: Normal range of  motion.     Right lower leg: No edema.     Left lower leg: No edema.  Skin:    General: Skin is warm and dry.     Capillary Refill: Capillary refill takes less than 2 seconds.     Comments: Urticarial rash of her chest and proximal arms to the level of mid humerus.  Neurological:     Mental Status: He is alert. Mental status is at baseline.  Psychiatric:        Mood and Affect: Mood normal.        Behavior: Behavior normal.     ED Results / Procedures / Treatments   Labs (all labs ordered are listed, but only abnormal results are displayed) Labs Reviewed  BASIC METABOLIC PANEL - Abnormal; Notable for the following components:      Result Value   Glucose, Bld 103 (*)    Calcium 8.7 (*)    All other components within normal limits  CBC WITH DIFFERENTIAL/PLATELET - Abnormal; Notable for the following components:   WBC 11.2 (*)    Neutro Abs 8.7 (*)    All other components within normal limits    EKG None  Radiology No results found.  Procedures Procedures (including critical care time)  Medications Ordered in ED Medications  famotidine (PEPCID) IVPB 20 mg premix (0 mg Intravenous Stopped 03/04/19 2035)  predniSONE (DELTASONE) tablet 60 mg (60 mg Oral Given 03/04/19 1948)  acetaminophen (TYLENOL) tablet 650 mg (650 mg Oral Given 03/04/19 1949)  sodium chloride 0.9 % bolus 1,000 mL (1,000 mLs Intravenous New Bag/Given 03/04/19 1953)  metoCLOPramide (REGLAN) injection 10 mg (10 mg Intravenous Given 03/04/19 1954)    ED Course  I have reviewed the triage vital signs and the nursing notes.  Pertinent labs & imaging results that were available during my care of the patient were reviewed by me and considered in my medical decision making (see chart for details).    MDM Rules/Calculators/A&P                      Patient presents today with urticarial rash.  Patient had Covid vaccine yesterday.  This may or may not be the cause of his urticarial rash today.  Patient has no  known drug triggers.  Has not had a urticarial reaction in the past.  No known allergies.  No new exposures that he knows of.  No new animals or detergents in the house.  No new foods.  Is well-appearing has no symptoms of anaphylaxis besides the cutaneous reaction.  Does not meet criteria for anaphylaxis.  No indication to discharge with EpiPen.  No indication for epinephrine today.  Patient had mild elevation in temperature prior to Tylenol administration likely due to patient just receiving Covid vaccine.  He has no cough  chest pain or shortness of breath indicate pneumonia.  No urinary symptoms.  No sore throat.  He does have body aches but suspect this is also related to Covid vaccine.  Low suspicion for infectious etiology likely this is reactive.  Mild leukocytosis likely secondary to Covid vaccine as well.  No other abnormalities on blood work.  Patient given 1 L normal saline he received Benadryl from EMS.  Given famotidine and 1 dose of prednisone 60.  Also given Reglan for his headache.  Reassessed he appears much improved.  No longer has cutaneous rash.  Is asleep in bed on my evaluation.  He continues to breathe well and have no other symptoms of anaphylaxis besides a cutaneous rash which is now resolved.  Discharge patient with recommendations use Benadryl.  He will follow-up with his primary care doctor.  He will return to ED if he has any new or concerning symptoms.   Richard Dyer was evaluated in Emergency Department on 03/04/2019 for the symptoms described in the history of present illness. He was evaluated in the context of the global COVID-19 pandemic, which necessitated consideration that the patient might be at risk for infection with the SARS-CoV-2 virus that causes COVID-19. Institutional protocols and algorithms that pertain to the evaluation of patients at risk for COVID-19 are in a state of rapid change based on information released by regulatory bodies including the CDC  and federal and state organizations. These policies and algorithms were followed during the patient's care in the ED.  The medical records were personally reviewed by myself. I personally reviewed all lab results and interpreted all imaging studies and either concurred with their official read or contacted radiology for clarification.   This patient appears reasonably screened and I doubt any other medical condition requiring further workup, evaluation, or treatment in the ED at this time prior to discharge.   Patient's vitals are WNL apart from vital sign abnormalities discussed above, patient is in NAD, and able to ambulate in the ED at their baseline and able to tolerate PO.  Pain has been managed or a plan has been made for home management and has no complaints prior to discharge. Patient is comfortable with above plan and for discharge at this time. All questions were answered prior to disposition. Results from the ER workup discussed with the patient face to face and all questions answered to the best of my ability. The patient is safe for discharge with strict return precautions. Patient appears safe for discharge with appropriate follow-up. Conveyed my impression with the patient and they voiced understanding and are agreeable to plan.   An After Visit Summary was printed and given to the patient.  Portions of this note were generated with [employer]. Dictation errors may occur despite best attempts at proofreading.    Final Clinical Impression(s) / ED Diagnoses Final diagnoses:  Allergic reaction, initial encounter  Hives  Urticaria    Rx / DC Orders ED Discharge Orders    None       Tedd Sias, [place] 03/04/19 2121    Drenda Freeze, MD 03/04/19 2231

## 2019-03-04 NOTE — ED Triage Notes (Signed)
Pt arrives via [place] [facility] after having covid vaccine yesterday now has hives to arm and on chest with a headache.   50 mg benadryl IV given by ems  120/86 92HR 98%

## 2019-12-12 IMAGING — CT CT ANGIO CHEST
3 of 7 series · 17 of 36 positions shown · IV contrast (Omni 300)
Comparison: Chest radiograph performed earlier today at [DATE] a.m.

CLINICAL DATA: Acute onset of hypoxic respiratory failure.

EXAM:
CT ANGIOGRAPHY CHEST WITH CONTRAST
TECHNIQUE: Multidetector CT imaging of the chest was performed using the
standard protocol during bolus administration of intravenous
contrast. Multiplanar CT image reconstructions and MIPs were
obtained to evaluate the vascular anatomy.
CONTRAST:  100mL [id number] IOPAMIDOL ([id number]-AR3) INJECTION 76%

[Series 7: pe lung · axial · 0.70mm/px · z∈[+1336,+1480]mm · 4 of 121 slices shown]
[im 25/121  mediastinal]
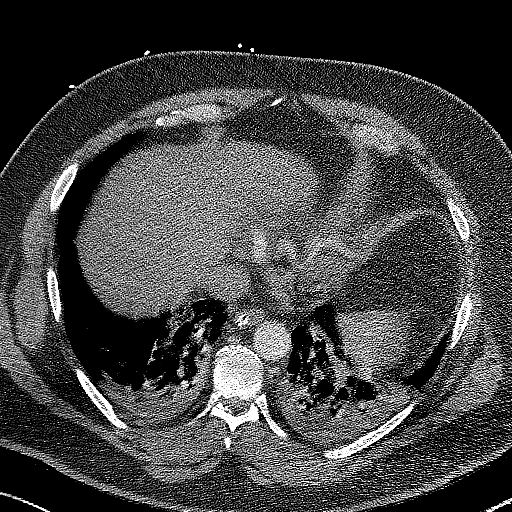
[im 49/121  mediastinal]
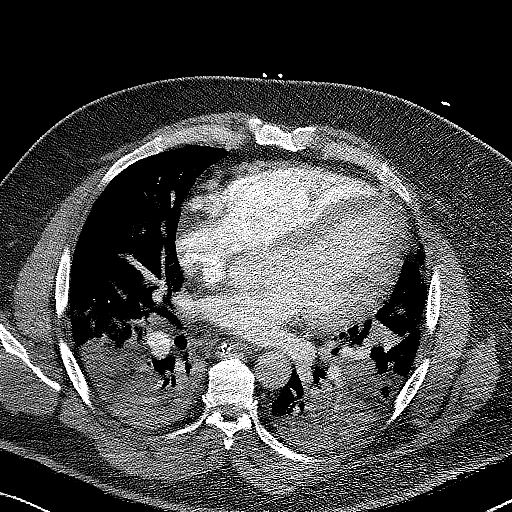
[im 73/121  mediastinal]
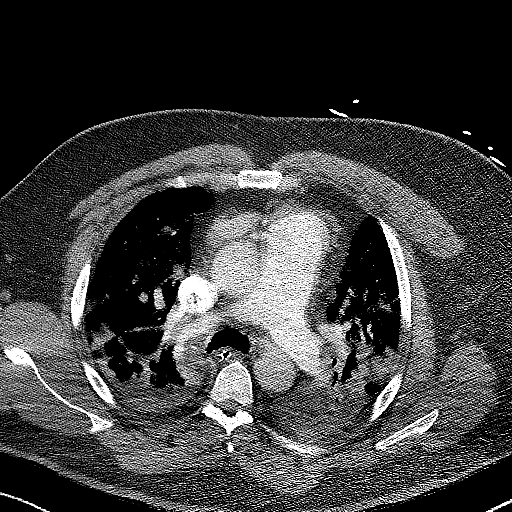
[im 97/121  mediastinal]
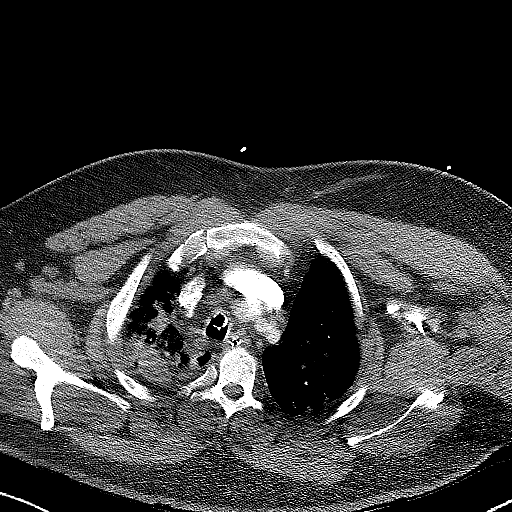

[Series 8: pe thins · axial · 0.87mm/px · z∈[+1272,+1506]mm · 12 of 278 slices shown]
[im 22/278  lung]
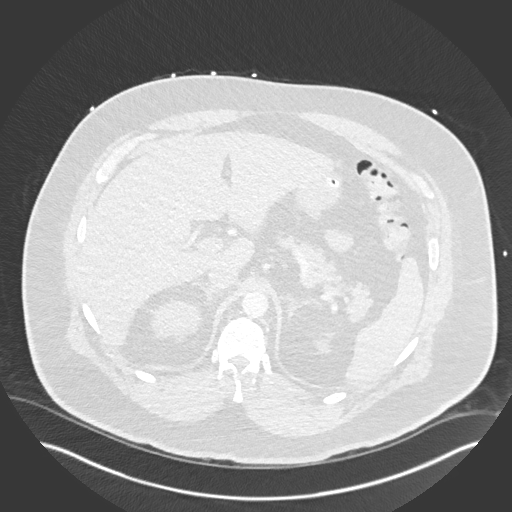
[im 43/278  mediastinal]
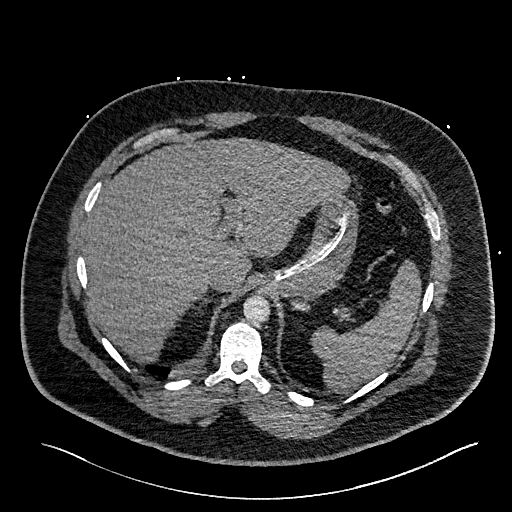
[im 64/278  lung]
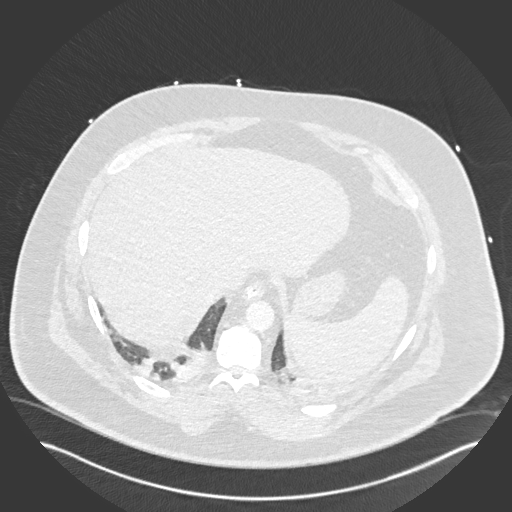
[im 86/278  mediastinal]
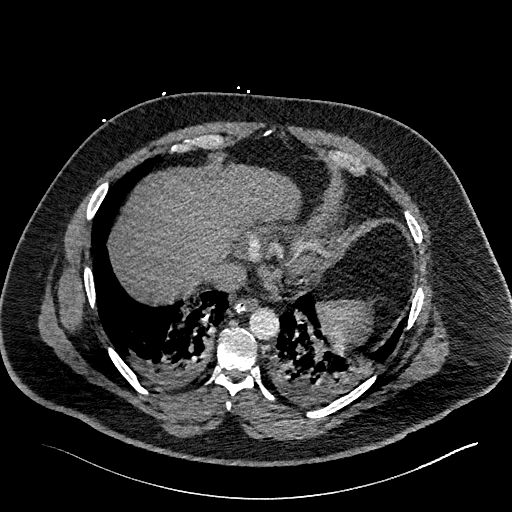
[im 107/278  lung]
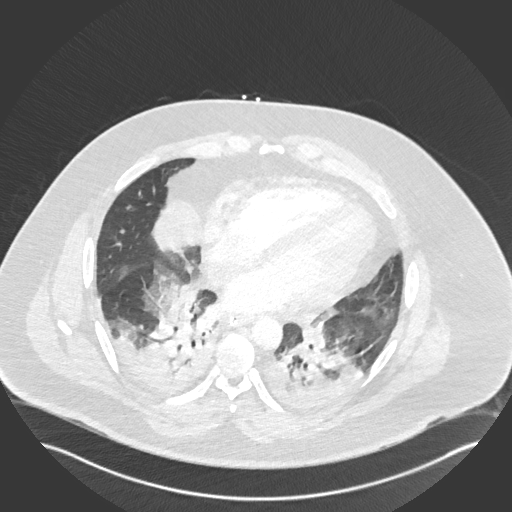
[im 128/278  mediastinal]
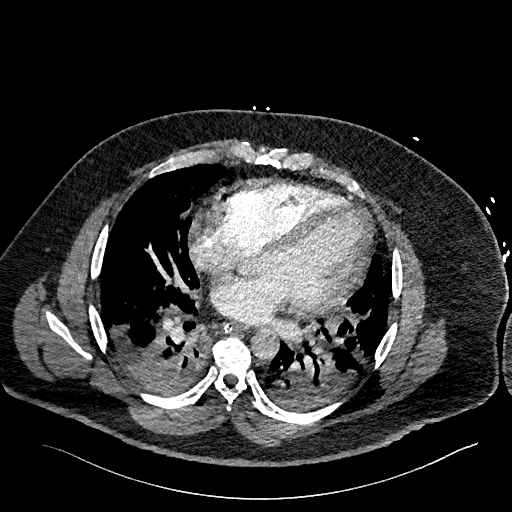
[im 150/278  lung]
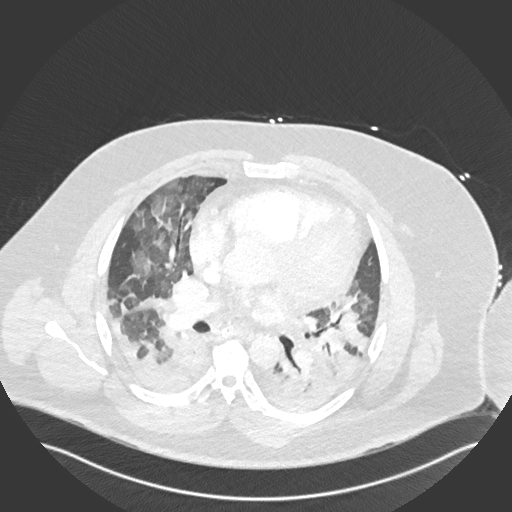
[im 171/278  mediastinal]
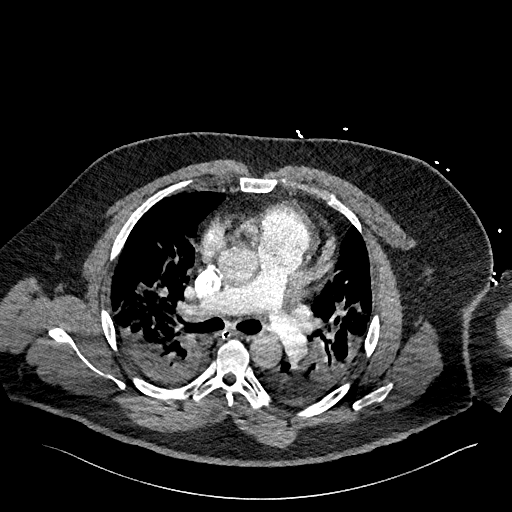
[im 192/278  lung]
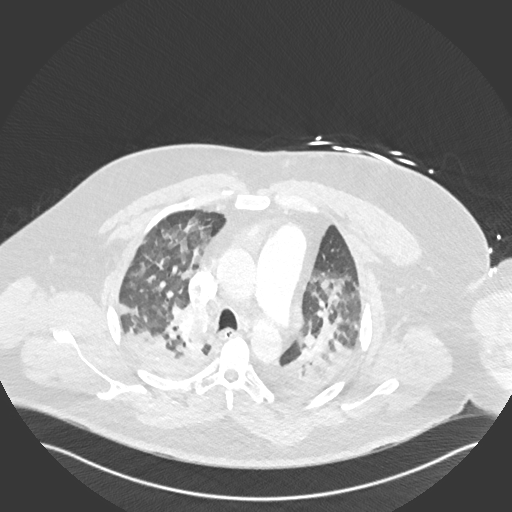
[im 214/278  mediastinal]
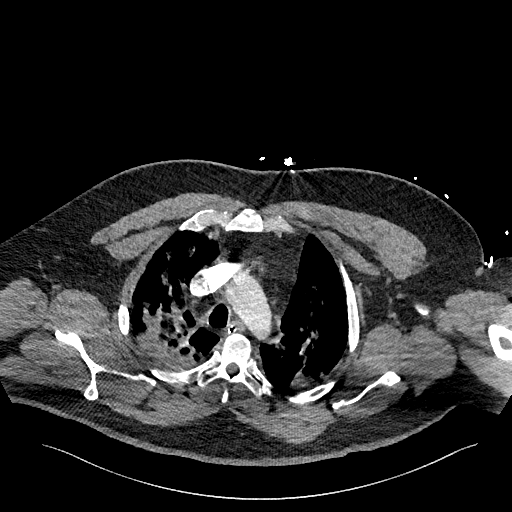
[im 235/278  lung]
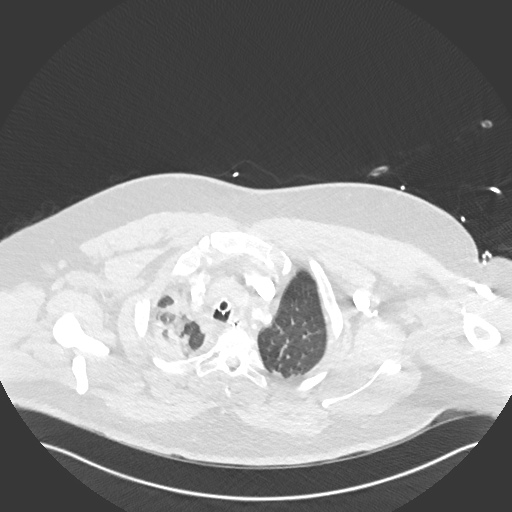
[im 256/278  mediastinal]
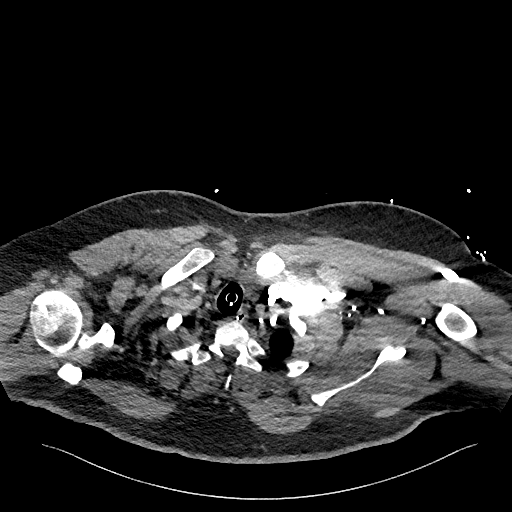

[Series 9: pe 2mm cor · coronal · 0.58mm/px · 1 of 156 slices shown]
[im 78/156  mediastinal]
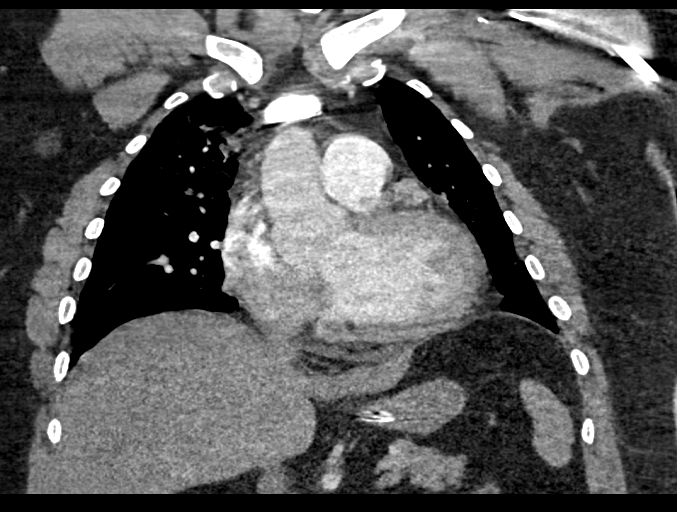

[17 of 36 positions shown; findings below may reference images not displayed]

FINDINGS: Cardiovascular: There is no evidence of central pulmonary embolus.
Evaluation for pulmonary embolus is suboptimal in areas of airspace
consolidation.

The heart is borderline normal in size. The thoracic aorta is
grossly unremarkable. The great vessels are within normal limits.

Mediastinum/Nodes: The mediastinum is grossly unremarkable in
appearance. No mediastinal lymphadenopathy is seen. Trace
pericardial fluid remains within normal limits. The visualized
portions of the thyroid gland are unremarkable. No axillary
lymphadenopathy is seen.

The patient's endotracheal tube is seen ending 4 cm above the
carina.

Lungs/Pleura: Diffuse bilateral airspace opacification is noted,
with additional hazy airspace opacity in the expanded portions of
the lungs. This may reflect pulmonary edema or pneumonia. Given
clinical presentation, ARDS is a concern. No definite pleural
effusion or pneumothorax is seen.

Upper Abdomen: The visualized portions of the liver and spleen are
unremarkable. The visualized portions of the pancreas, gallbladder,
adrenal glands and kidneys are within normal limits. An enteric tube
is noted extending to the body of the stomach.

Musculoskeletal: No acute osseous abnormalities are identified. The
visualized musculature is unremarkable in appearance.

Review of the MIP images confirms the above findings.
IMPRESSION: 1. No evidence of central pulmonary embolus.
2. Diffuse bilateral airspace opacification, with additional hazy
airspace opacity in the expanded portions of the lungs. This may
reflect pulmonary edema or pneumonia. Given clinical presentation,
ARDS is a concern.

## 2019-12-12 IMAGING — DX DG CHEST 1V PORT
1 series · 1 of 1 positions shown · non-contrast
Comparison: None.

CLINICAL DATA: Endotracheal and orogastric tube placement

EXAM:
PORTABLE CHEST 1 VIEW

[chest ap]
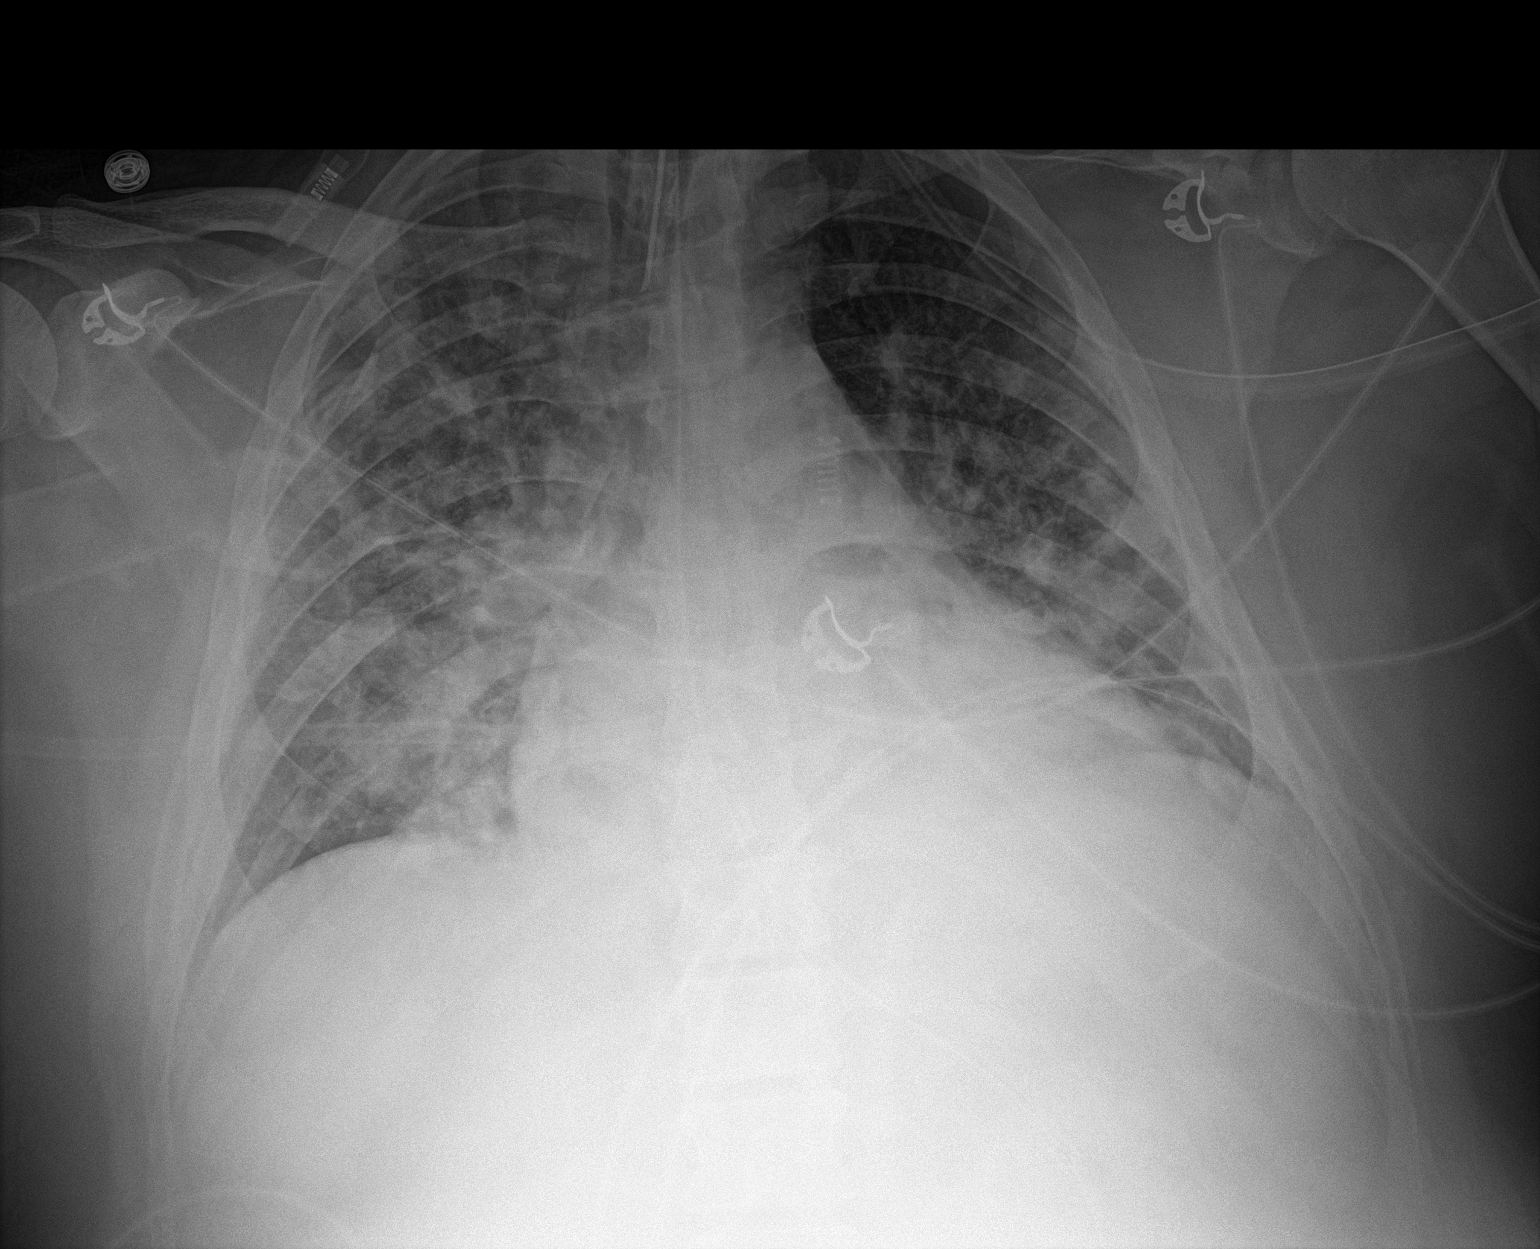

[1 of 1 positions shown; findings below may reference images not displayed]

FINDINGS: There is moderate cardiomegaly with multifocal bilateral airspace
opacities. The endotracheal tube tip is at the level of the
clavicular heads. Orogastric tube side port overlies the gastric
body.
IMPRESSION: 1. Endotracheal tube and orogastric tube in radiographically
appropriate position.
2. Moderate cardiomegaly with multifocal bilateral airspace
opacities which may indicate multifocal infection or pulmonary
edema.

## 2019-12-15 IMAGING — DX DG CHEST 1V PORT
1 series · 1 of 1 positions shown · non-contrast
Comparison: 04/28/2017.

CLINICAL DATA: Pneumonia.

EXAM:
PORTABLE CHEST 1 VIEW

[chest]
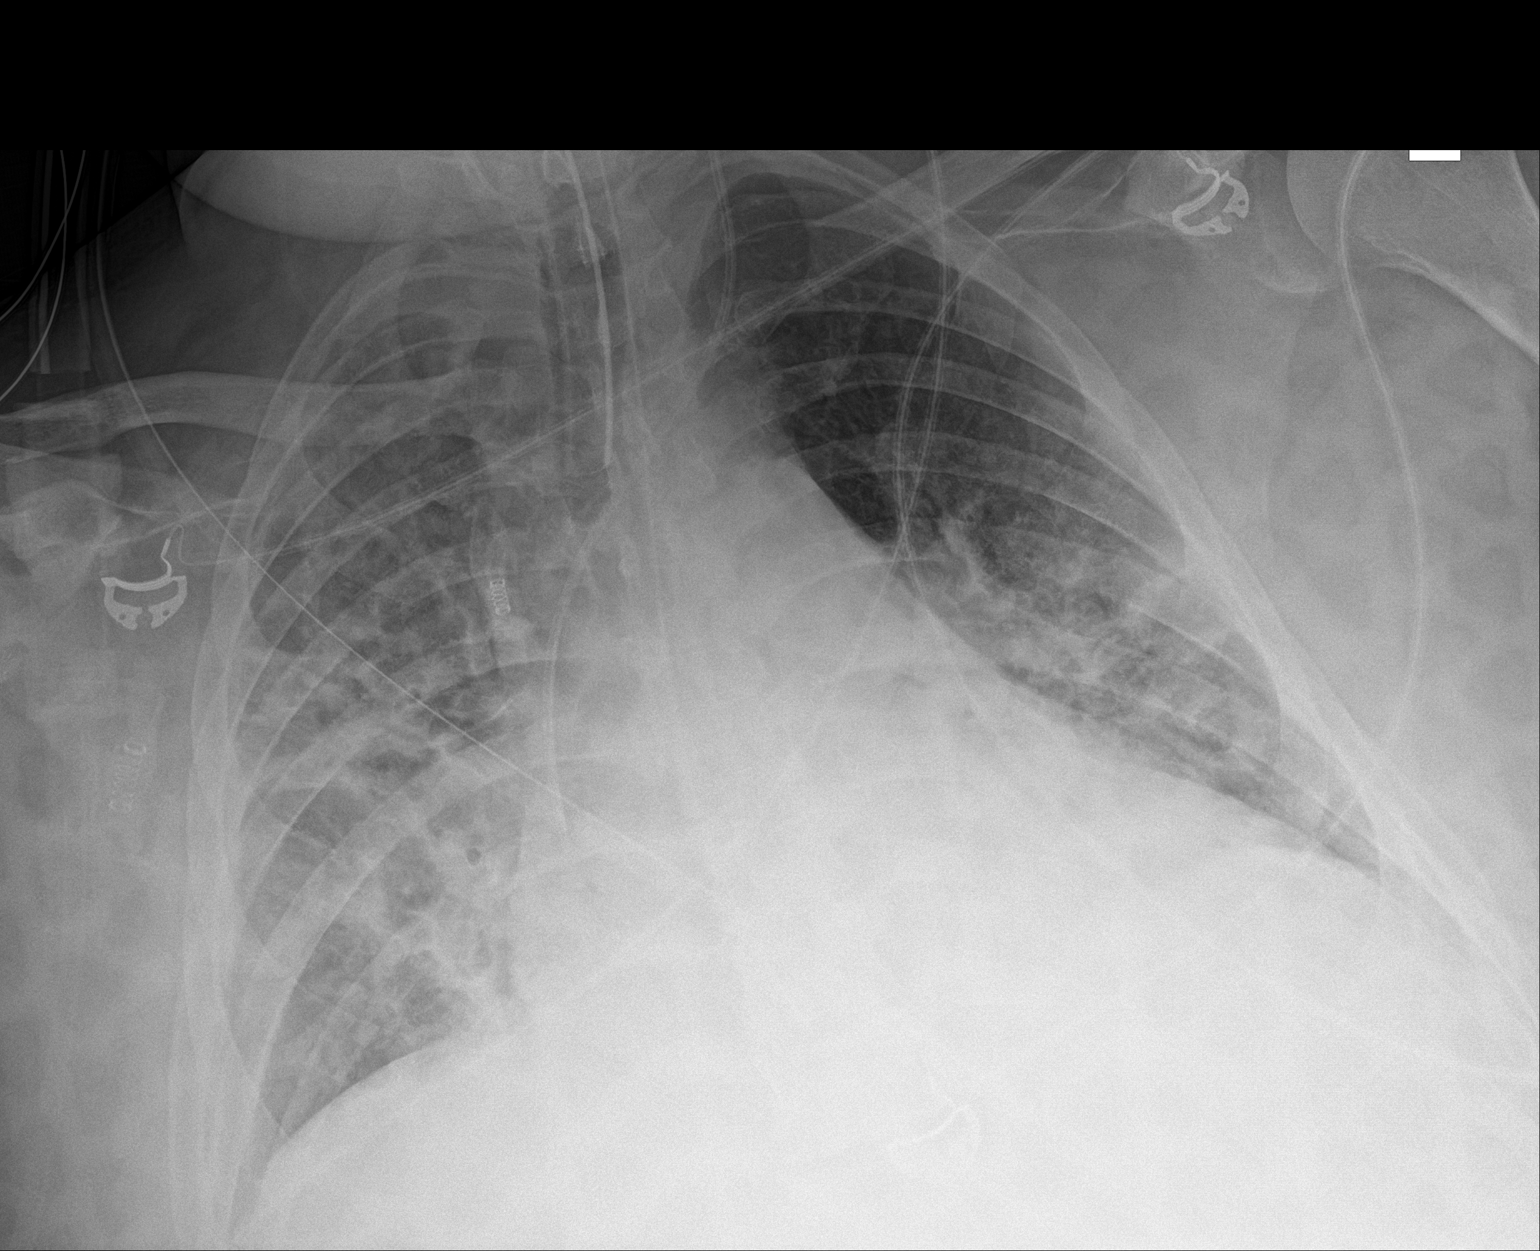

[1 of 1 positions shown; findings below may reference images not displayed]

FINDINGS: Endotracheal tube, NG tube, left IJ line in stable position.
Cardiomegaly unchanged. Diffuse bilateral pulmonary
infiltrates/edema unchanged. No prominent pleural effusion. Stable
biapical pleural thickening. No pneumothorax.
IMPRESSION: 1.  Lines and tubes in stable position.

2.  Cardiomegaly unchanged.

3. Persistent bilateral pulmonary infiltrates/edema unchanged. Chest
is unchanged from prior exam.

## 2019-12-16 IMAGING — DX DG ABD PORTABLE 1V
1 series · 1 of 1 positions shown · non-contrast
Comparison: None in PACs

CLINICAL DATA: Status post feeding tube placement.

EXAM:
PORTABLE ABDOMEN - 1 VIEW

[abdomen kub]
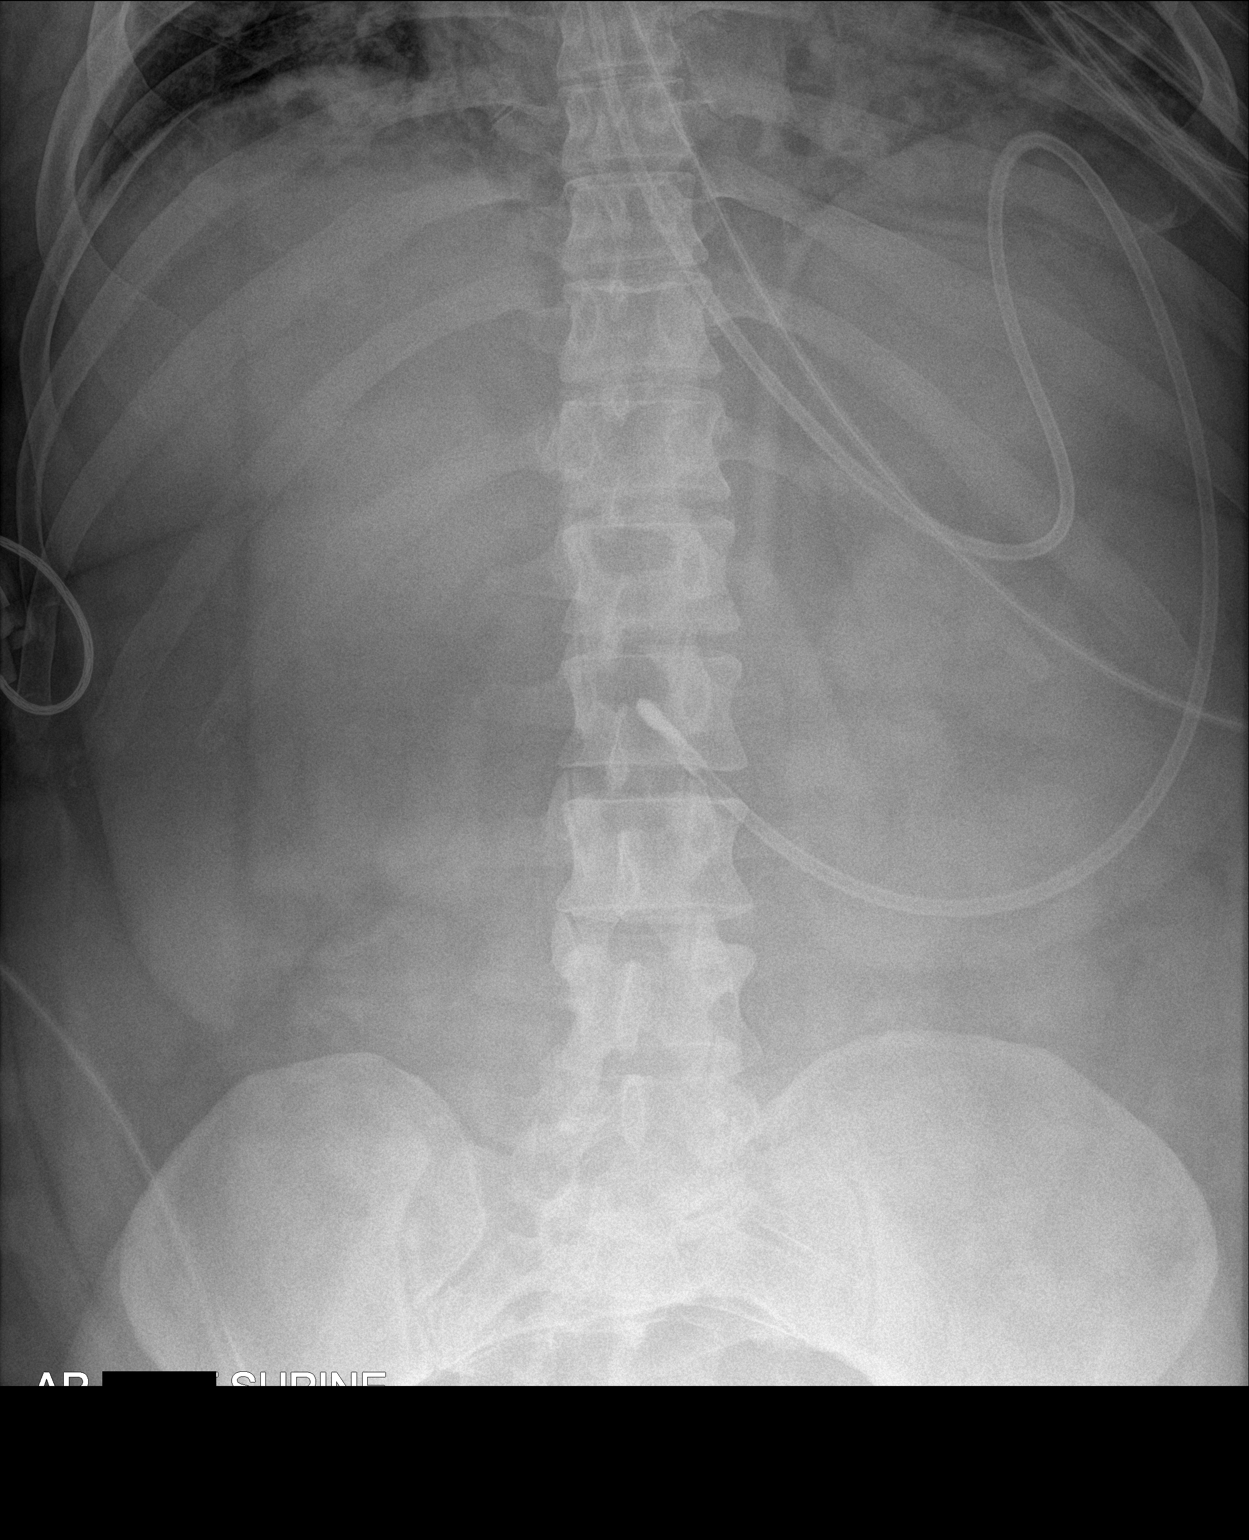

[1 of 1 positions shown; findings below may reference images not displayed]

FINDINGS: The radiodense tipped feeding tube is coiled with in the stomach
with the tip directed toward the pyloric region. Adequate slack is
present which should allow the tip to be propelled into the
duodenum.
IMPRESSION: The tip of the feeding tube lies in the distal gastric body and is
directed toward the pylorus.

## 2019-12-17 IMAGING — DX DG CHEST 1V PORT
1 series · 1 of 1 positions shown · non-contrast
Comparison: 04/30/2017.

CLINICAL DATA: ARDS P

EXAM:
PORTABLE CHEST 1 VIEW

[chest]
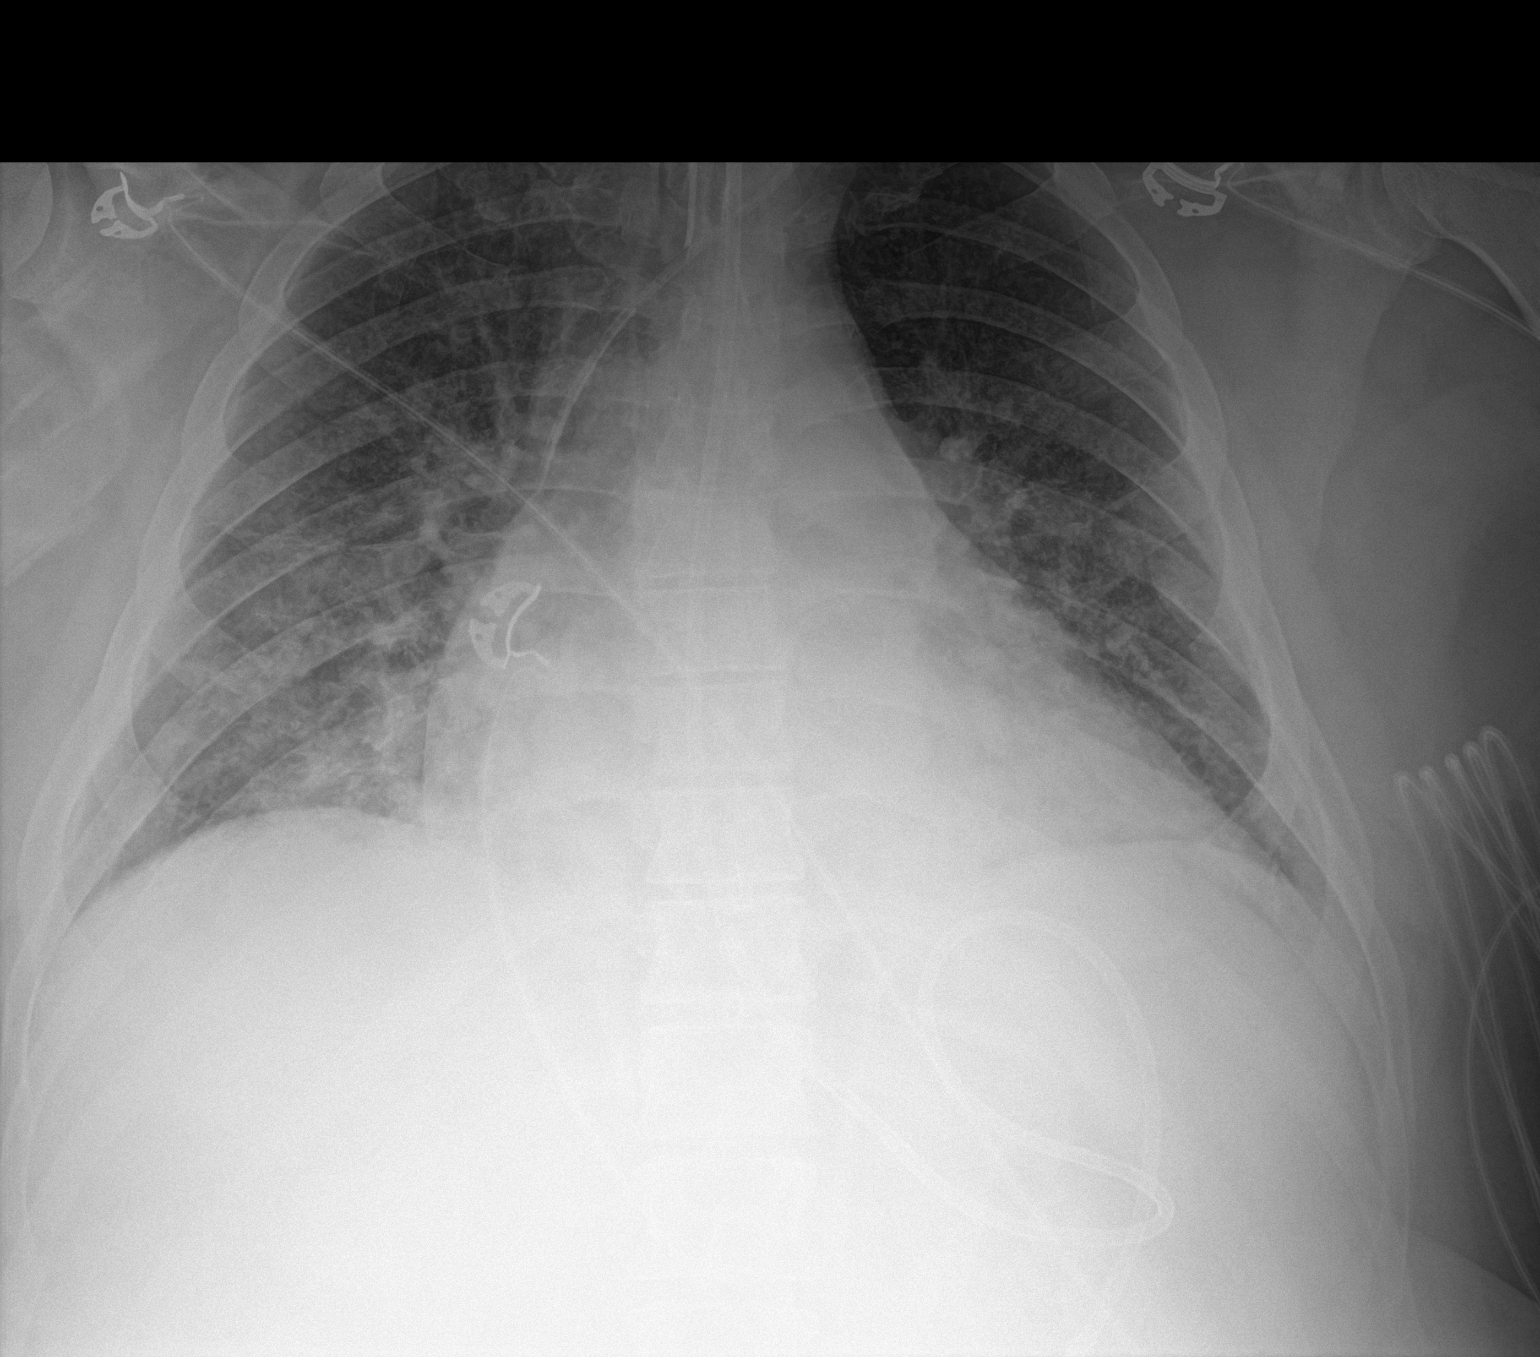

[1 of 1 positions shown; findings below may reference images not displayed]

FINDINGS: Interim removal of NG tube and placement of feeding tube. Feeding
tube tip below the hemidiaphragm. Endotracheal tube in stable
position. Stable cardiomegaly. Persistent but partially cleared
bilateral pulmonary infiltrates/edema. No pleural effusion or
pneumothorax.
IMPRESSION: 1. Interim removal of NG tube and placement of feeding tube. Feeding
tube tip below left hemidiaphragm. Endotracheal tube in stable
position.

2.  Persistent partially cleared bilateral from infiltrates/edema.

3.  Stable cardiomegaly.

## 2021-10-10 DIAGNOSIS — Z0289 Encounter for other administrative examinations: Secondary | ICD-10-CM

## 2021-11-01 ENCOUNTER — Ambulatory Visit (INDEPENDENT_AMBULATORY_CARE_PROVIDER_SITE_OTHER): Payer: No Typology Code available for payment source | Admitting: Family Medicine

## 2021-11-01 ENCOUNTER — Encounter (INDEPENDENT_AMBULATORY_CARE_PROVIDER_SITE_OTHER): Payer: Self-pay | Admitting: Family Medicine

## 2021-11-01 VITALS — BP 111/75 | HR 69 | Temp 99.1°F | Ht 69.0 in | Wt 357.0 lb

## 2021-11-01 DIAGNOSIS — R0602 Shortness of breath: Secondary | ICD-10-CM | POA: Diagnosis not present

## 2021-11-01 DIAGNOSIS — R7303 Prediabetes: Secondary | ICD-10-CM

## 2021-11-01 DIAGNOSIS — E782 Mixed hyperlipidemia: Secondary | ICD-10-CM | POA: Diagnosis not present

## 2021-11-01 DIAGNOSIS — Z9189 Other specified personal risk factors, not elsewhere classified: Secondary | ICD-10-CM

## 2021-11-01 DIAGNOSIS — R7989 Other specified abnormal findings of blood chemistry: Secondary | ICD-10-CM

## 2021-11-01 DIAGNOSIS — E66813 Obesity, class 3: Secondary | ICD-10-CM

## 2021-11-01 DIAGNOSIS — K760 Fatty (change of) liver, not elsewhere classified: Secondary | ICD-10-CM

## 2021-11-01 DIAGNOSIS — Z1331 Encounter for screening for depression: Secondary | ICD-10-CM

## 2021-11-01 DIAGNOSIS — Z6841 Body Mass Index (BMI) 40.0 and over, adult: Secondary | ICD-10-CM

## 2021-11-01 DIAGNOSIS — R5383 Other fatigue: Secondary | ICD-10-CM

## 2021-11-01 DIAGNOSIS — G4733 Obstructive sleep apnea (adult) (pediatric): Secondary | ICD-10-CM

## 2021-11-01 DIAGNOSIS — Z951 Presence of aortocoronary bypass graft: Secondary | ICD-10-CM

## 2021-11-03 LAB — VITAMIN D 25 HYDROXY (VIT D DEFICIENCY, FRACTURES): Vit D, 25-Hydroxy: 26.7 ng/mL — ABNORMAL LOW (ref 30.0–100.0)

## 2021-11-03 LAB — INSULIN, RANDOM: INSULIN: 33.8 u[IU]/mL — ABNORMAL HIGH (ref 2.6–24.9)

## 2021-11-03 LAB — T3: T3, Total: 151 ng/dL (ref 71–180)

## 2021-11-03 LAB — T4, FREE: Free T4: 1.18 ng/dL (ref 0.82–1.77)

## 2021-11-03 LAB — FOLATE: Folate: 14.8 ng/mL (ref >3.0)

## 2021-11-06 NOTE — Progress Notes (Signed)
Chief Complaint:   OBESITY Richard Dyer (MR# [id number]) is a 46 y.o. male who presents for evaluation and treatment of obesity and related comorbidities. Current BMI is Body mass index is 52.72 kg/m. Richard Dyer has been struggling with his weight for many years and has been unsuccessful in either losing weight, maintaining weight loss, or reaching his healthy weight goal.  Richard Dyer referred wife with wheat allergy. Works as Advice worker. No definitive hours but significant weight gain since 2019 hospitalization. Eating out 5+ times a week for breakfast and lunch, [employer]-sausage, egg, cheese mcmuffins +/- Frappe or celsius. Sometimes skips lunch or late lunch. Lunch--5$ big meal, jr bacon cheese burger, fries, nuggets,soda (eat all + feel satisfied) or [place] fajitas. Dinner--meatloaf and instant mashed potatoes or steak+baked potatoes (1/2 cup) (satisfied).  [name] is currently in the action stage of change and ready to dedicate time achieving and maintaining a healthier weight. [name] is interested in becoming our patient and working on intensive lifestyle modifications including (but not limited to) diet and exercise for weight loss.  Richard Dyer's habits were reviewed today and are as follows: His family eats meals together, he thinks his family will eat healthier with him, his desired weight loss is 141 lbs, he started gaining weight after his open heart sugery, his heaviest weight ever was 361 pounds, he has significant food cravings issues, he is frequently drinking liquids with calories, he frequently makes poor food choices, and he frequently eats larger portions than normal.  Depression Screen Richard Dyer's Food and Mood (modified PHQ-9) score was 14.     11/01/2021    9:49 AM  Depression screen PHQ 2/9  Decreased Interest 3  Down, Depressed, Hopeless 0  PHQ - 2 Score 3  Altered sleeping 3  Tired, decreased energy 3  Change in appetite 0  Feeling bad or failure about  yourself  0  Trouble concentrating 2  Moving slowly or fidgety/restless 3  Suicidal thoughts 0  PHQ-9 Score 14  Difficult doing work/chores Extremely dIfficult   Subjective:   1. Other fatigue EKG--inverted T waves V1+V2 (unfortunately cannot see recent EKGs from [facility]). Richard Dyer admits to daytime somnolence and admits to waking up still tired. Patient has a history of symptoms of daytime fatigue, morning fatigue, and morning headache. Richard Dyer generally gets 6 hours of sleep per night, and states that he has difficulty falling asleep. Snoring is present. Apneic episodes are not present. Epworth Sleepiness Score is 11.    2. SOBOE (shortness of breath on exertion) Larkin [name] notes increasing shortness of breath with exercising and seems to be worsening over time with weight gain. He notes getting out of breath sooner with activity than he used to. This has not gotten worse recently. Ashtian denies shortness of breath at rest or orthopnea.   3. Prediabetes Richard Dyer's last A1c 6.0. New diagnoses. He is not on medication.  4. Mixed hyperlipidemia Richard Dyer is on Atorvastatin. Last LDL 162, HDL 47, Trigly 110.  5. OSA (obstructive sleep apnea) Richard Dyer has OSA likely. Sleep test in 1 week at [facility] pulmonary.  6. Low testosterone Low sex drive. 1st testosterone level 205. Free Testosterone 43.  7. NAFLD (nonalcoholic fatty liver disease) [name] told he was given diagnoses at time of major hospitalized in 2019. LFT's within normal limits on labs last week.  8. Hx of CABG Lelend has not seen cardiology since 2020.  9. At risk for deficient intake of food The patient is at a higher than average risk of deficient  intake of food due to skipping meals.  Assessment/Plan:   1. Other fatigue We will obtain labs today. Richard Dyer does feel that his weight is causing his energy to be lower than it should be. Fatigue may be related to obesity, depression or many other causes. Labs will be ordered, and in the  meanwhile, Richard Dyer will focus on self care including making healthy food choices, increasing physical activity and focusing on stress reduction.   - EKG 12-Lead - Folate - VITAMIN D 25 Hydroxy (Vit-D Deficiency, Fractures) - T3 - T4, free  2. SOBOE (shortness of breath on exertion) Richard Dyer does feel that he gets out of breath more easily that he used to when he exercises. Richard Dyer's shortness of breath appears to be obesity related and exercise induced. He has agreed to work on weight loss and gradually increase exercise to treat his exercise induced shortness of breath. Will continue to monitor closely.   3. Prediabetes We will obtain labs today.  - Insulin, random  4. Mixed hyperlipidemia FLP in 3 months.  5. OSA (obstructive sleep apnea) Follow up on plan at next appointment.  6. Low testosterone Richard Dyer will follow up with Dr. Alinda Money for further management.  7. NAFLD (nonalcoholic fatty liver disease) Follow up with CMP in 3 months.  8. Hx of CABG Richard Dyer to call to schedule a follow up appointment with cardiology.  9. At risk for deficient intake of food Richard Dyer was given approximately 15 minutes of deficient intake of food prevention counseling today. Richard Dyer is at risk for eating too few calories based on current food recall. He was encouraged to focus on meeting caloric and protein goals according to his recommended meal plan.   10. Depression screening Richard Dyer had a positive depression screening. Depression is commonly associated with obesity and often results in emotional eating behaviors. We will monitor this closely and work on CBT to help improve the non-hunger eating patterns. Referral to Psychology may be required if no improvement is seen as he continues in our clinic.   11. Class 3 severe obesity with serious comorbidity and body mass index (BMI) of 40.0 to 44.9 in adult, unspecified obesity type (HCC) Richard Dyer is currently in the action stage of change and his goal is to  continue with weight loss efforts. I recommend Richard Dyer begin the structured treatment plan as follows:  He has agreed to the Category 4 Plan +100+GF free option.  Exercise goals: No exercise has been prescribed at this time.   Behavioral modification strategies: increasing lean protein intake, meal planning and cooking strategies, keeping healthy foods in the home, and planning for success.  He was informed of the importance of frequent follow-up visits to maximize his success with intensive lifestyle modifications for his multiple health conditions. He was informed we would discuss his lab results at his next visit unless there is a critical issue that needs to be addressed sooner. Jabarie agreed to keep his next visit at the agreed upon time to discuss these results.  Objective:   Blood pressure 111/75, pulse 69, temperature 99.1 F (37.3 C), height '5\' 9"'$  (1.753 m), weight (!) 357 lb (161.9 kg), SpO2 97 %. Body mass index is 52.72 kg/m.  EKG: Normal sinus rhythm, rate 65 bpm.  Indirect Calorimeter completed today shows a VO2 of 431 ml and a REE of 2981.  His calculated basal metabolic rate is 1610 thus his basal metabolic rate is worse than expected.  General: Cooperative, alert, well developed, in no acute distress. HEENT:  Conjunctivae and lids unremarkable. Cardiovascular: Regular rhythm.  Lungs: Normal work of breathing. Neurologic: No focal deficits.   Lab Results  Component Value Date   CREATININE 1.14 03/04/2019   BUN 12 03/04/2019   NA 139 03/04/2019   K 3.8 03/04/2019   CL 105 03/04/2019   CO2 24 03/04/2019   Lab Results  Component Value Date   ALT 243 (H) 05/07/2017   AST 75 (H) 05/07/2017   ALKPHOS 64 05/07/2017   BILITOT 1.0 05/07/2017   No results found for: "HGBA1C" Lab Results  Component Value Date   INSULIN 33.8 (H) 11/01/2021   No results found for: "TSH" Lab Results  Component Value Date   TRIG 237 (H) 05/03/2017   Lab Results  Component Value  Date   WBC 11.2 (H) 03/04/2019   HGB 15.4 03/04/2019   HCT 44.4 03/04/2019   MCV 88.1 03/04/2019   PLT 257 03/04/2019   No results found for: "IRON", "TIBC", "FERRITIN"  Attestation Statements:   Reviewed by clinician on day of visit: allergies, medications, problem list, medical history, surgical history, family history, social history, and previous encounter notes.  I, Elnora Morrison, RMA am acting as transcriptionist for Coralie Common, MD.  This is the patient's first visit at [facility]. The patient's NEW PATIENT PACKET was reviewed at length. Included in the packet: current and past health history, medications, allergies, ROS, gynecologic history (women only), surgical history, family history, social history, weight history, weight loss surgery history (for those that have had weight loss surgery), nutritional evaluation, mood and food questionnaire, PHQ9, Epworth questionnaire, sleep habits questionnaire, patient life and health improvement goals questionnaire. These will all be scanned into the patient's chart under media.   During the visit, I independently reviewed the patient's EKG, bioimpedance scale results, and indirect calorimeter results. I used this information to tailor a meal plan for the patient that will help him to lose weight and will improve his obesity-related conditions going forward. I performed a medically necessary appropriate examination and/or evaluation. I discussed the assessment and treatment plan with the patient. The patient was provided an opportunity to ask questions and all were answered. The patient agreed with the plan and demonstrated an understanding of the instructions. Labs were ordered at this visit and will be reviewed at the next visit unless more critical results need to be addressed immediately. Clinical information was updated and documented in the EMR.   Time spent on visit including pre-visit chart review and post-visit care  was 40 minutes.   I have reviewed the above documentation for accuracy and completeness, and I agree with the above. - Coralie Common, MD

## 2021-11-08 ENCOUNTER — Other Ambulatory Visit: Payer: Self-pay

## 2021-11-08 ENCOUNTER — Encounter: Payer: Self-pay | Admitting: Adult Health

## 2021-11-08 ENCOUNTER — Ambulatory Visit (INDEPENDENT_AMBULATORY_CARE_PROVIDER_SITE_OTHER): Payer: No Typology Code available for payment source

## 2021-11-08 ENCOUNTER — Ambulatory Visit (INDEPENDENT_AMBULATORY_CARE_PROVIDER_SITE_OTHER): Payer: No Typology Code available for payment source | Admitting: Adult Health

## 2021-11-08 VITALS — BP 112/78 | HR 77 | Temp 99.1°F | Ht 68.5 in | Wt 356.8 lb

## 2021-11-08 DIAGNOSIS — R0602 Shortness of breath: Secondary | ICD-10-CM

## 2021-11-08 DIAGNOSIS — R0600 Dyspnea, unspecified: Secondary | ICD-10-CM | POA: Insufficient documentation

## 2021-11-08 DIAGNOSIS — R0609 Other forms of dyspnea: Secondary | ICD-10-CM | POA: Diagnosis not present

## 2021-11-08 DIAGNOSIS — R0683 Snoring: Secondary | ICD-10-CM

## 2021-11-08 DIAGNOSIS — J80 Acute respiratory distress syndrome: Secondary | ICD-10-CM | POA: Diagnosis not present

## 2021-11-08 NOTE — Assessment & Plan Note (Signed)
Intermittent dyspnea and cough.  Patient has a history of ARDS.  Also former smoker.  Check chest x-ray today.  Check PFTs on return visit  Plan  Patient Instructions  Chest xray today  . Set up for spilt night sleep study  Work on healthy weight loss Healthy sleep regimen  Do not drive if sleepy  Follow up in 6 weeks with PFT and to discuss test results

## 2021-11-08 NOTE — Assessment & Plan Note (Signed)
History of ARDS with critical illness in 2019.  Patient has chronic cough and shortness of breath.  We will check chest x-ray today.  And set up PFTs on return visit.

## 2021-11-08 NOTE — Progress Notes (Signed)
$'@Patient'b$  ID: Richard Dyer, male    DOB: 10/14/1975, 46 y.o.   MRN: [id number]  Chief Complaint  Patient presents with   Consult    Snoring with 1 minute without breathing. Not rested when wake in morning.    Referring provider: Gweneth Fritter, FNP  HPI: 46 year old male seen for sleep consult October 31, 2021 for snoring, witnessed apneic events, and daytime sleepiness Medical history significant for critical illness in 2019 with severe strep infection complicated by vent dependent respiratory failure pneumonia, sepsis, ARDS.  After discharged developed a PE.  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  Patient complains of severe snoring, witnessed apneic events, daytime sleepiness.  Patient says he wakes up feeling very unrefreshed and tired as if he did not sleep at all.  Patient says he has been told for years that he should be checked out for sleep apnea.  Typically goes to bed about 10 to 11 PM.  Takes only a couple minutes to go to sleep.  Is up several times throughout the night feels that his sleep is very fragmented and restless.  Gets up about 6 AM.  Weight is up about 150 pounds over the last couple years.  Current weight is at 356/BMI 53.  Typically takes in about 2 sodas daily.  Does not use any sleep aids.  No history of congestive heart failure or stroke.  No symptoms suspicious for cataplexy or sleep paralysis.  Epworth score is 10.  Typically gets sleepy if he sits down to watch TV, passenger in a car and in the afternoon hours.  Patient says he had a critical illness and in 2019 with severe strep infection with tonsillitis this was complicated by vent dependent respiratory failure, pneumonia, sepsis, ARDS.  Patient says he was in the hospital for over 2 weeks.  He  says when he got discharged he did develop a PE after discharge.  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  Patient says he is a former smoker.  Since discharge patient says he does get short of breath.  Feels that his weight plays a lot into his breathing issues.  Patient says he had COVID-19 in January 2023.  Since then has had ongoing dry cough.  He denies any hemoptysis, fever, discolored mucus.   Social history patient is married.  Has 2 children.  Patient says he is very active at work.  He works as a Insurance underwriter.  Quit smoking in 2019.  Drinks very rarely with maybe 1-2 beers a year.  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  Past Medical History:  Diagnosis Date   Acne    ARDS (adult respiratory distress syndrome) (HCC)    Back pain    Chronic fatigue syndrome    Fatty liver    Frequent urination  High cholesterol    Hx of blood clots    Joint pain    Low testosterone    Osteoarthritis    Prediabetes    Sleep apnea    SOB (shortness of breath)     Tobacco History: Social History   Tobacco Use  Smoking Status Former   Types: Cigarettes   Quit date: 04/22/2017   Years since quitting: 4.5  Smokeless Tobacco Never   Counseling given: Not Answered   Outpatient Medications Prior to Visit  Medication Sig Dispense Refill   albuterol (VENTOLIN HFA) 108 (90 Base) MCG/ACT inhaler Inhale 2 puffs into the lungs every 4 (four) hours as needed.     aspirin 325 MG tablet Take 325 mg by mouth daily.     atorvastatin (LIPITOR) 40 MG tablet Take  40 mg by mouth daily.     ipratropium-albuterol (DUONEB) 0.5-2.5 (3) MG/3ML SOLN Take 3 mLs by nebulization 2 (two) times daily. (Patient not taking: Reported on 11/08/2021) 360 mL 0   No facility-administered medications prior to visit.     Review of Systems:   Constitutional:   No  weight loss, night sweats,  Fevers, chills, fatigue, or  lassitude.  HEENT:   No headaches,  Difficulty swallowing,  Tooth/dental problems, or  Sore throat,                No sneezing, itching, ear ache, nasal congestion, post nasal drip,   CV:  No chest pain,  Orthopnea, PND, swelling in lower extremities, anasarca, dizziness, palpitations, syncope.   GI  No heartburn, indigestion, abdominal pain, nausea, vomiting, diarrhea, change in bowel habits, loss of appetite, bloody stools.   Resp: No shortness of breath with exertion or at rest.  No excess mucus, no productive cough,  No non-productive cough,  No coughing up of blood.  No change in color of mucus.  No wheezing.  No chest wall deformity  Skin: no rash or lesions.  GU: no dysuria, change in color of urine, no urgency or frequency.  No flank pain, no hematuria   MS:  No joint pain or swelling.  No decreased range of motion.  No back pain.    Physical Exam  BP 112/78 (BP Location: Left Arm, Patient Position: Sitting, Cuff Size: Large)   Pulse 77   Temp 99.1 F (37.3 C) (Oral)   Ht 5' 8.5" (1.74 m)   Wt (!) 356 lb 12.8 oz (161.8 kg)   SpO2 95%   BMI 53.46 kg/m   GEN: A/Ox3; pleasant , NAD, well nourished, BMI 53   HEENT:  [place]/AT,  NOSE-clear, THROAT-clear, no lesions, no postnasal drip or exudate noted.  Class III-IV MP airway  NECK:  Supple w/ fair ROM; no JVD; normal carotid impulses w/o bruits; no thyromegaly or nodules palpated; no lymphadenopathy.    RESP  Clear  P & A; w/o, wheezes/ rales/ or rhonchi. no accessory muscle use, no dullness to percussion  CARD:  RRR, no m/r/g, tr  peripheral edema, pulses intact, no cyanosis or  clubbing.  GI:   Soft & nt; nml bowel sounds; no organomegaly or masses detected.   Musco: Warm bil, no deformities or joint swelling noted.   Neuro: alert, no focal deficits noted.    Skin: Warm, no lesions or rashes    Lab Results:  CBC   BMET   BNP No results found for: "BNP"  ProBNP No results found for: "PROBNP"  Imaging: No results found.  No data to display          No results found for: "NITRICOXIDE"      Assessment & Plan:   Snoring Snoring, daytime sleepiness, restless sleep, witnessed apneic events, BMI 53 all concerning for underlying sleep apnea patient education given - discussed how weight can impact sleep and risk for sleep disordered breathing - discussed options to assist with weight loss: combination of diet modification, cardiovascular and strength training exercises   - had an extensive discussion regarding the adverse health consequences related to untreated sleep disordered breathing - specifically discussed the risks for hypertension, coronary artery disease, cardiac dysrhythmias, cerebrovascular disease, and diabetes - lifestyle modification discussed   - discussed how sleep disruption can increase risk of accidents, particularly when driving - safe driving practices were discussed   We will set patient up for a split-night sleep study  Plan Patient Instructions  Chest xray today  . Set up for spilt night sleep study  Work on healthy weight loss Healthy sleep regimen  Do not drive if sleepy  Follow up in 6 weeks with PFT and to discuss test results       ARDS (adult respiratory distress syndrome) ([place]) History of ARDS with critical illness in 2019.  Patient has chronic cough and shortness of breath.  We will check chest x-ray today.  And set up PFTs on return visit.  Dyspnea Intermittent dyspnea and cough.  Patient has a history of ARDS.  Also former smoker.  Check chest x-ray today.  Check PFTs on return  visit  Plan  Patient Instructions  Chest xray today  . Set up for spilt night sleep study  Work on healthy weight loss Healthy sleep regimen  Do not drive if sleepy  Follow up in 6 weeks with PFT and to discuss test results         Rexene Edison, NP 11/08/2021

## 2021-11-08 NOTE — Assessment & Plan Note (Signed)
Snoring, daytime sleepiness, restless sleep, witnessed apneic events, BMI 53 all concerning for underlying sleep apnea patient education given - discussed how weight can impact sleep and risk for sleep disordered breathing - discussed options to assist with weight loss: combination of diet modification, cardiovascular and strength training exercises   - had an extensive discussion regarding the adverse health consequences related to untreated sleep disordered breathing - specifically discussed the risks for hypertension, coronary artery disease, cardiac dysrhythmias, cerebrovascular disease, and diabetes - lifestyle modification discussed   - discussed how sleep disruption can increase risk of accidents, particularly when driving - safe driving practices were discussed   We will set patient up for a split-night sleep study  Plan Patient Instructions  Chest xray today  . Set up for spilt night sleep study  Work on healthy weight loss Healthy sleep regimen  Do not drive if sleepy  Follow up in 6 weeks with PFT and to discuss test results

## 2021-11-08 NOTE — Patient Instructions (Addendum)
Chest xray today  . Set up for spilt night sleep study  Work on healthy weight loss Healthy sleep regimen  Do not drive if sleepy  Follow up in 6 weeks with PFT and to discuss test results

## 2021-11-09 NOTE — Progress Notes (Signed)
Reviewed and agree with assessment/plan.   Chesley Mires, MD [facility] Pulmonary/Critical Care 11/09/2021, 8:39 AM Pager:  [phone]

## 2021-11-13 ENCOUNTER — Encounter (INDEPENDENT_AMBULATORY_CARE_PROVIDER_SITE_OTHER): Payer: Self-pay | Admitting: Family Medicine

## 2021-11-13 ENCOUNTER — Ambulatory Visit (INDEPENDENT_AMBULATORY_CARE_PROVIDER_SITE_OTHER): Payer: No Typology Code available for payment source | Admitting: Family Medicine

## 2021-11-13 VITALS — BP 111/74 | HR 97 | Temp 98.9°F | Ht 69.0 in | Wt 351.0 lb

## 2021-11-13 DIAGNOSIS — R7303 Prediabetes: Secondary | ICD-10-CM | POA: Diagnosis not present

## 2021-11-13 DIAGNOSIS — E669 Obesity, unspecified: Secondary | ICD-10-CM | POA: Diagnosis not present

## 2021-11-13 DIAGNOSIS — E7849 Other hyperlipidemia: Secondary | ICD-10-CM

## 2021-11-13 DIAGNOSIS — Z951 Presence of aortocoronary bypass graft: Secondary | ICD-10-CM

## 2021-11-13 DIAGNOSIS — Z6841 Body Mass Index (BMI) 40.0 and over, adult: Secondary | ICD-10-CM

## 2021-11-13 DIAGNOSIS — E559 Vitamin D deficiency, unspecified: Secondary | ICD-10-CM

## 2021-11-13 MED ORDER — VITAMIN D (ERGOCALCIFEROL) 1.25 MG (50000 UNIT) PO CAPS: 50000.0000 [IU] | ORAL_CAPSULE | ORAL | 0 refills | Status: DC

## 2021-11-14 NOTE — Progress Notes (Signed)
Chief Complaint:   OBESITY Richard Dyer is here to discuss his progress with his obesity treatment plan along with follow-up of his obesity related diagnoses. Richard Dyer is on the Category 4 Plan +100 and states he is following his eating plan approximately 95% of the time. Donevin states he is walking 5,000 steps 6 times per week.  Today's visit was #: 2 Starting weight: 357 lbs Starting date: 11/01/2021 Today's weight: 351 lbs Today's date: 11/13/2021 Total lbs lost to date: 6 lbs Total lbs lost since last in-office visit: 6  Interim History: One thing that Delante has discovered was that he does not like [employer] bread (prefers [employer]). Brings lunch with him and eats breakfast at 6:30 am before leaving. Getting in 10 oz meat and 2 cups of vegetables. Snack calories have been string cheese, PB pretzels. No changes need to be made by next appointment.  Subjective:   1. Other hyperlipidemia Labs discussed during visit today. LDL 162, HDL 47, Trigly 110 (labs 10/25/21). On Lipitor for 1-2 months.  2. Vitamin D deficiency Vit D 26.7. Notes fatigue. Irfan is not on supplementation.   3. Prediabetes A1c 6.0, insulin 33.8. Previously Larkin [name] did have increase drive for carbs.  4. Hx of CABG In 2019 during major illness. Gurkaran is on Asprin and Lipitor only.   Assessment/Plan:   1. Other hyperlipidemia Will repeat labs in January.  2. Vitamin D deficiency START Vit D 50,000 IU once a week for 1 month with 0 refills.  -Start Vitamin D, Ergocalciferol, (DRISDOL) 1.25 MG (50000 UNIT) CAPS capsule; Take 1 capsule (50,000 Units total) by mouth every 7 (seven) days.  Dispense: 4 capsule; Refill: 0  3. Prediabetes Follow up labs in Jan 2024. Continue Cat 4.  4. Hx of CABG Follow up with patient at next appointment to see if he has called to scheduled appointment cardiology.  5. Obesity with current BMI of 51.9 Isom is currently in the action stage of change. As such, his goal is  to continue with weight loss efforts. He has agreed to the Category 4 Plan +100.   Exercise goals: All adults should avoid inactivity. Some physical activity is better than none, and adults who participate in any amount of physical activity gain some health benefits.  Behavioral modification strategies: increasing lean protein intake, meal planning and cooking strategies, keeping healthy foods in the home, and planning for success.  Khalee has agreed to follow-up with our clinic in 3 weeks. He was informed of the importance of frequent follow-up visits to maximize his success with intensive lifestyle modifications for his multiple health conditions.   Objective:   Blood pressure 111/74, pulse 97, temperature 98.9 F (37.2 C), height '5\' 9"'$  (1.753 m), weight (!) 351 lb (159.2 kg), SpO2 98 %. Body mass index is 51.83 kg/m.  General: Cooperative, alert, well developed, in no acute distress. HEENT: Conjunctivae and lids unremarkable. Cardiovascular: Regular rhythm.  Lungs: Normal work of breathing. Neurologic: No focal deficits.   Lab Results  Component Value Date   CREATININE 1.14 03/04/2019   BUN 12 03/04/2019   NA 139 03/04/2019   K 3.8 03/04/2019   CL 105 03/04/2019   CO2 24 03/04/2019   Lab Results  Component Value Date   ALT 243 (H) 05/07/2017   AST 75 (H) 05/07/2017   ALKPHOS 64 05/07/2017   BILITOT 1.0 05/07/2017   No results found for: "HGBA1C" Lab Results  Component Value Date   INSULIN 33.8 (H) 11/01/2021  No results found for: "TSH" Lab Results  Component Value Date   TRIG 237 (H) 05/03/2017   Lab Results  Component Value Date   VD25OH 26.7 (L) 11/01/2021   Lab Results  Component Value Date   WBC 11.2 (H) 03/04/2019   HGB 15.4 03/04/2019   HCT 44.4 03/04/2019   MCV 88.1 03/04/2019   PLT 257 03/04/2019   No results found for: "IRON", "TIBC", "FERRITIN"  Attestation Statements:   Reviewed by clinician on day of visit: allergies, medications,  problem list, medical history, surgical history, family history, social history, and previous encounter notes.  Time spent on visit including pre-visit chart review and post-visit care and charting was 45 minutes.   I, Elnora Morrison, RMA am acting as transcriptionist for Coralie Common, MD.  I have reviewed the above documentation for accuracy and completeness, and I agree with the above. - Coralie Common, MD

## 2021-11-15 ENCOUNTER — Ambulatory Visit (INDEPENDENT_AMBULATORY_CARE_PROVIDER_SITE_OTHER): Payer: Self-pay | Admitting: Family Medicine

## 2021-12-09 ENCOUNTER — Other Ambulatory Visit (INDEPENDENT_AMBULATORY_CARE_PROVIDER_SITE_OTHER): Payer: Self-pay | Admitting: Family Medicine

## 2021-12-09 DIAGNOSIS — E559 Vitamin D deficiency, unspecified: Secondary | ICD-10-CM

## 2021-12-11 ENCOUNTER — Ambulatory Visit (INDEPENDENT_AMBULATORY_CARE_PROVIDER_SITE_OTHER): Payer: No Typology Code available for payment source | Admitting: Family Medicine

## 2021-12-24 ENCOUNTER — Ambulatory Visit (INDEPENDENT_AMBULATORY_CARE_PROVIDER_SITE_OTHER): Payer: No Typology Code available for payment source | Admitting: Pulmonary Disease

## 2021-12-24 ENCOUNTER — Encounter: Payer: Self-pay | Admitting: Adult Health

## 2021-12-24 ENCOUNTER — Ambulatory Visit (INDEPENDENT_AMBULATORY_CARE_PROVIDER_SITE_OTHER): Payer: No Typology Code available for payment source | Admitting: Adult Health

## 2021-12-24 ENCOUNTER — Telehealth: Payer: Self-pay | Admitting: *Deleted

## 2021-12-24 VITALS — BP 118/86 | HR 78 | Temp 98.4°F | Ht 69.0 in | Wt 354.2 lb

## 2021-12-24 DIAGNOSIS — J80 Acute respiratory distress syndrome: Secondary | ICD-10-CM | POA: Diagnosis not present

## 2021-12-24 DIAGNOSIS — R0683 Snoring: Secondary | ICD-10-CM | POA: Diagnosis not present

## 2021-12-24 DIAGNOSIS — R0609 Other forms of dyspnea: Secondary | ICD-10-CM | POA: Diagnosis not present

## 2021-12-24 LAB — PULMONARY FUNCTION TEST
DL/VA % pred: 118 %
DL/VA: 5.41 ml/min/mmHg/L
DLCO cor % pred: 86 %
DLCO cor: 25.5 ml/min/mmHg
DLCO unc % pred: 90 %
DLCO unc: 26.52 ml/min/mmHg
FEF 25-75 Post: 3.28 L/sec
FEF 25-75 Pre: 3.05 L/sec
FEF2575-%Change-Post: 7 %
FEF2575-%Pred-Post: 89 %
FEF2575-%Pred-Pre: 82 %
FEV1-%Change-Post: 4 %
FEV1-%Pred-Post: 69 %
FEV1-%Pred-Pre: 67 %
FEV1-Post: 2.78 L
FEV1-Pre: 2.67 L
FEV1FVC-%Change-Post: 5 %
FEV1FVC-%Pred-Pre: 107 %
FEV6-%Change-Post: -1 %
FEV6-%Pred-Post: 63 %
FEV6-%Pred-Pre: 64 %
FEV6-Post: 3.09 L
FEV6-Pre: 3.15 L
FEV6FVC-%Change-Post: 0 %
FEV6FVC-%Pred-Post: 102 %
FEV6FVC-%Pred-Pre: 102 %
FVC-%Change-Post: -1 %
FVC-%Pred-Post: 61 %
FVC-%Pred-Pre: 62 %
FVC-Post: 3.11 L
FVC-Pre: 3.15 L
Post FEV1/FVC ratio: 89 %
Post FEV6/FVC ratio: 99 %
Pre FEV1/FVC ratio: 85 %
Pre FEV6/FVC Ratio: 100 %
RV % pred: 90 %
RV: 1.68 L
TLC % pred: 73 %
TLC: 4.98 L

## 2021-12-24 NOTE — Progress Notes (Signed)
PFT done today. 

## 2021-12-24 NOTE — Progress Notes (Signed)
$'@Patient'J$  ID: Richard Dyer, male    DOB: 26-Feb-1975, 46 y.o.   MRN: [id number]  Chief Complaint  Patient presents with   Follow-up    Referring provider: No ref. provider found  HPI: 46 year old male seen for consult November 08, 2021 for snoring, witnessed apneic events and daytime sleepiness along and shortness of breath and cough  TEST/EVENTS :  Critical illness in 2019 with severe strep infection with tonsillitis this was complicated by vent dependent respiratory failure, pneumonia, sepsis, ARDS.  PE after discharge.  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  Patient was seen last visit for a consult.  Patient was having snoring or witnessed apneic events and daytime sleepiness.  Was set up for a in lab split-night sleep study.  Patient has an extensive history with critical illness in 2019 with respiratory failure, previous heart surgery.  Unfortunately sleep study has not been set up yet.  Last visit patient complained of intermittent cough and shortness of breath has been going on since 2019.  As above patient had critical illness in 2019 with acute respiratory failure requiring vent support, complications with pneumonia, sepsis, ARDS and PE.  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  And gets short of breath with activities.  He was set up for pulmonary function testing that shows moderate restriction with FEV1 at 69%, ratio 89, FVC 61%, no significant bronchodilator response, DLCO 90%.  Patient says he has  episode of chest pain recently was seen in the emergency room.  CT chest done December 22, 2021 showed mild bibasilar atelectasis, no PE.  Was told negative for cardiac work-up.  Chest discomfort has resolved.  Denies any hemoptysis,  calf pain orthopnea, edema.  Currently has minimum cough..   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  Past Medical History:  Diagnosis Date   Acne    ARDS (adult respiratory distress syndrome) (HCC)    Back pain    Chronic fatigue syndrome    Fatty liver    Frequent urination    High cholesterol    Hx of blood clots    Joint pain    Low testosterone    Osteoarthritis    Prediabetes    Sleep apnea    SOB (shortness of breath)     Tobacco History: Social History   Tobacco Use  Smoking Status Former   Types: Cigarettes   Quit date: 04/22/2017   Years since quitting: 4.6  Smokeless Tobacco Never   Counseling given: Not Answered   Outpatient Medications Prior to Visit  Medication Sig Dispense Refill   albuterol (VENTOLIN HFA) 108 (90 Base) MCG/ACT inhaler Inhale 2 puffs into the lungs every 4 (four) hours as needed.     aspirin 325 MG tablet Take 325 mg by mouth daily.     atorvastatin (LIPITOR) 40 MG tablet Take 40  mg by mouth daily.     Vitamin D, Ergocalciferol, (DRISDOL) 1.25 MG (50000 UNIT) CAPS capsule Take 1 capsule (50,000 Units total) by mouth every 7 (seven) days. 4 capsule 0   No facility-administered medications prior to visit.     Review of Systems:   Constitutional:   No  weight loss, night sweats,  Fevers, chills,  +fatigue, or  lassitude.  HEENT:   No headaches,  Difficulty swallowing,  Tooth/dental problems, or  Sore throat,                No sneezing, itching, ear ache, nasal congestion, post nasal drip,   CV:  No chest pain,  Orthopnea, PND, swelling in lower extremities, anasarca,  dizziness, palpitations, syncope.   GI  No heartburn, indigestion, abdominal pain, nausea, vomiting, diarrhea, change in bowel habits, loss of appetite, bloody stools.   Resp:   No chest wall deformity  Skin: no rash or lesions.  GU: no dysuria, change in color of urine, no urgency or frequency.  No flank pain, no hematuria   MS:  No joint pain or swelling.  No decreased range of motion.  No back pain.    Physical Exam  BP 118/86 (BP Location: Left Arm, Patient Position: Sitting, Cuff Size: Large)   Pulse 78   Temp 98.4 F (36.9 C) (Oral)   Ht '5\' 9"'$  (1.753 m)   Wt (!) 354 lb 3.2 oz (160.7 kg)   SpO2 97%   BMI 52.31 kg/m   GEN: A/Ox3; pleasant , NAD, well nourished , BMI 52   HEENT:  [place]/AT,  NOSE-clear, THROAT-clear, no lesions, no postnasal drip or exudate noted. Class IV MP airway   NECK:  Supple w/ fair ROM; no JVD; normal carotid impulses w/o bruits; no thyromegaly or nodules palpated; no lymphadenopathy.    RESP  Clear  P & A; w/o, wheezes/ rales/ or rhonchi. no accessory muscle use, no dullness to percussion  CARD:  RRR, no m/r/g, no peripheral edema, pulses intact, no cyanosis or clubbing.  GI:   Soft & nt; nml bowel sounds; no organomegaly or masses detected.   Musco: Warm bil, no deformities or joint swelling noted.   Neuro: alert, no focal deficits noted.    Skin: Warm, no lesions or rashes    Lab Results:  CBC    BNP No results found for: "BNP"  ProBNP No results found for: "PROBNP"  Imaging: No results found.       Latest Ref Rng & Units 12/24/2021    8:39 AM  PFT Results  FVC-Pre L 3.15  P  FVC-Predicted Pre % 62  P  FVC-Post L 3.11  P  FVC-Predicted Post % 61  P  Pre FEV1/FVC % % 85  P  Post FEV1/FCV % % 89  P  FEV1-Pre L 2.67  P  FEV1-Predicted Pre % 67  P  FEV1-Post L 2.78  P  DLCO uncorrected ml/min/mmHg 26.52  P  DLCO UNC% % 90  P  DLCO corrected ml/min/mmHg 25.50  P  DLCO COR %Predicted % 86  P  DLVA Predicted % 118  P   TLC L 4.98  P  TLC % Predicted % 73  P  RV % Predicted % 90  P    P Preliminary result    No results found for: "NITRICOXIDE"      Assessment & Plan:   Snoring Snoring, daytime sleepiness, witnessed apneic events, BMI 53 all concerning for possible underlying sleep apnea  and hypoventilation. Split-night sleep study is pending.  Plan Patient Instructions  Albuterol inhaler As needed   Activity as tolerated  Delsym 2 tsp Twice daily  As needed cough.   Sleep study as discussed  Work on healthy weight loss Healthy sleep regimen  Do not drive if sleepy  Follow up in 3 months and As needed      Dyspnea Suspect is multifactorial in nature PFTs show moderate restriction which appears to be related to his weight.  Diffusing capacity is normal.  Recent CT chest was negative for PE.  Minimal bibasilar atelectasis despite previous ARDS in 2019. May use albuterol as needed for possible underlying reactive airways.  Hold on additional inhalers at this time. 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    Plan  Patient Instructions  Albuterol inhaler As needed   Activity as tolerated  Delsym 2 tsp Twice daily  As needed cough.   Sleep study as discussed  Work on healthy weight loss Healthy sleep regimen  Do not drive if sleepy  Follow up in 3 months and As needed      ARDS (adult respiratory distress syndrome) ([place]) History of ARDS in 2019.  Recent CT chest shows minimum bibasilar atelectasis, PFTs show moderate restriction with normal diffusing capacity.     Rexene Edison, NP 12/24/2021

## 2021-12-24 NOTE — Assessment & Plan Note (Signed)
Suspect is multifactorial in nature PFTs show moderate restriction which appears to be related to his weight.  Diffusing capacity is normal.  Recent CT chest was negative for PE.  Minimal bibasilar atelectasis despite previous ARDS in 2019. May use albuterol as needed for possible underlying reactive airways.  Hold on additional inhalers at this time. 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    Plan  Patient Instructions  Albuterol inhaler As needed   Activity as tolerated  Delsym 2 tsp Twice daily  As needed cough.   Sleep study as discussed  Work on healthy weight loss Healthy sleep regimen  Do not drive if sleepy  Follow up in 3 months and As needed

## 2021-12-24 NOTE — Telephone Encounter (Signed)
Celina at [facility] [phone]), LVM to return call and to call patient to schedule patient for Split Night sleep study that was ordered 11/08/2021.

## 2021-12-24 NOTE — Assessment & Plan Note (Signed)
History of ARDS in 2019.  Recent CT chest shows minimum bibasilar atelectasis, PFTs show moderate restriction with normal diffusing capacity.

## 2021-12-24 NOTE — Assessment & Plan Note (Signed)
Snoring, daytime sleepiness, witnessed apneic events, BMI 53 all concerning for possible underlying sleep apnea and hypoventilation. Split-night sleep study is pending.  Plan Patient Instructions  Albuterol inhaler As needed   Activity as tolerated  Delsym 2 tsp Twice daily  As needed cough.   Sleep study as discussed  Work on healthy weight loss Healthy sleep regimen  Do not drive if sleepy  Follow up in 3 months and As needed

## 2021-12-24 NOTE — Patient Instructions (Addendum)
Albuterol inhaler As needed   Activity as tolerated  Delsym 2 tsp Twice daily  As needed cough.   Sleep study as discussed  Work on healthy weight loss Healthy sleep regimen  Do not drive if sleepy  Follow up in 3 months with Dr. Ander Slade or Royal Piedra NP

## 2021-12-24 NOTE — Telephone Encounter (Signed)
Follow up on Split night sleep study (awaiting prior auth) as of 12/24/2021.  [facility] state they are working on prior [place] on patients from 9/18.

## 2021-12-28 ENCOUNTER — Encounter (HOSPITAL_BASED_OUTPATIENT_CLINIC_OR_DEPARTMENT_OTHER): Payer: No Typology Code available for payment source | Admitting: Pulmonary Disease

## 2022-01-07 NOTE — Telephone Encounter (Signed)
Split night has been set up for 01/23/22 , can we set up ov 2 weeks after to review results

## 2022-01-09 NOTE — Telephone Encounter (Signed)
ATC patient, no answer.  LVM to return call.  When patient calls back, he needs a f/u 2 weeks after his split night sleep study is completed (01/23/22).

## 2022-01-18 NOTE — Telephone Encounter (Signed)
The front office staff scheduled the patient to follow up in February instead of 2 weeks after split night sleep study (12/13).  I called and left VM for patient to inquire if that was his intention to wait that long.  I asked that he call back to schedule a sooner visit as we have appointments available prior to a February appointment.  Will await return call.

## 2022-01-22 NOTE — Telephone Encounter (Signed)
Patient called back and is scheduled to f/u with Katie on 12/29.  Nothing further needed.

## 2022-01-23 ENCOUNTER — Ambulatory Visit (HOSPITAL_BASED_OUTPATIENT_CLINIC_OR_DEPARTMENT_OTHER): Payer: 59 | Attending: Adult Health | Admitting: Pulmonary Disease

## 2022-01-23 VITALS — Ht 69.0 in | Wt 355.0 lb

## 2022-01-23 DIAGNOSIS — R0683 Snoring: Secondary | ICD-10-CM | POA: Diagnosis not present

## 2022-01-23 DIAGNOSIS — G4733 Obstructive sleep apnea (adult) (pediatric): Secondary | ICD-10-CM

## 2022-01-25 DIAGNOSIS — G4733 Obstructive sleep apnea (adult) (pediatric): Secondary | ICD-10-CM | POA: Diagnosis not present

## 2022-01-25 DIAGNOSIS — R0683 Snoring: Secondary | ICD-10-CM

## 2022-01-25 NOTE — Procedures (Signed)
Patient Name: Richard Dyer, Blount Date: 01/23/2022 Gender: Male D.O.B: 18-Nov-1975 Age (years): 18 Referring Provider: Tammy Parrett Height (inches): 68 Interpreting Physician: Kara Mead MD, ABSM Weight (lbs): 355 RPSGT: Zadie Rhine BMI: 54 MRN: [id number] Neck Size: 20.00 CLINICAL INFORMATION Sleep Study Type: Split Night CPAP  Indication for sleep study: Obesity, Snoring, Witnessed Apneas  Epworth Sleepiness Score: 11  SLEEP STUDY TECHNIQUE As per the AASM Manual for the Scoring of Sleep and Associated Events v2.3 (April 2016) with a hypopnea requiring 4% desaturations.  The channels recorded and monitored were frontal, central and occipital EEG, electrooculogram (EOG), submentalis EMG (chin), nasal and oral airflow, thoracic and abdominal wall motion, anterior tibialis EMG, snore microphone, electrocardiogram, and pulse oximetry. Continuous positive airway pressure (CPAP) was initiated when the patient met split night criteria and was titrated according to treat sleep-disordered breathing.  MEDICATIONS Medications self-administered by patient taken the night of the study : N/A  RESPIRATORY PARAMETERS Diagnostic  Total AHI (/hr): 112.8 RDI (/hr): 115.1 OA Index (/hr): 83.3 CA Index (/hr): 0.5 REM AHI (/hr): N/A NREM AHI (/hr): 112.8 Supine AHI (/hr): 12.0 Non-supine AHI (/hr): 116.8 Min O2 Sat (%): 70.0 Mean O2 (%): 86.3 Time below 88% (min): 72.5   Titration  Optimal Pressure (cm): 18 AHI at Optimal Pressure (/hr): 0 Min O2 at Optimal Pressure (%): 88.0 Supine % at Optimal (%): 31 Sleep % at Optimal (%): 98   SLEEP ARCHITECTURE The recording time for the entire night was 411.9 minutes.  During a baseline period of 139.7 minutes, the patient slept for 130.3 minutes in REM and nonREM, yielding a sleep efficiency of 93.3%. Sleep onset after lights out was 2.4 minutes with a REM latency of N/A minutes. The patient spent 4.2% of the night in stage N1 sleep, 95.8% in stage  N2 sleep, 0.0% in stage N3 and 0% in REM.  During the titration period of 269.5 minutes, the patient slept for 254.5 minutes in REM and nonREM, yielding a sleep efficiency of 94.4%. Sleep onset after CPAP initiation was 5.6 minutes with a REM latency of 38.5 minutes. The patient spent 0.8% of the night in stage N1 sleep, 55.4% in stage N2 sleep, 12.4% in stage N3 and 31.4% in REM.  CARDIAC DATA The 2 lead EKG demonstrated sinus rhythm. The mean heart rate was 100.0 beats per minute. Other EKG findings include: None.    LEG MOVEMENT DATA The total Periodic Limb Movements of Sleep (PLMS) were 0. The PLMS index was 0.0 .  IMPRESSIONS Severe obstructive sleep apnea occurred during the diagnostic portion of the study (AHI = 112.8/hour). An optimal PAP pressure was selected for this patient ( 18 cm of water) No significant central sleep apnea occurred during the diagnostic portion of the study (CAI = 0.5/hour). Moderate oxygen desaturation was noted during the diagnostic portion of the study (Min O2 =70.0%). No snoring was audible during the diagnostic portion of the study. No cardiac abnormalities were noted during this study. Clinically significant periodic limb movements did not occur during sleep.   DIAGNOSIS Obstructive Sleep Apnea (G47.33)   RECOMMENDATIONS Trial of CPAP therapy on 18 cm H2O with a Medium size Resmed Full Face AirFit F20 mask and heated humidification. Avoid alcohol, sedatives and other CNS depressants that may worsen sleep apnea and disrupt normal sleep architecture. Sleep hygiene should be reviewed to assess factors that may improve sleep quality. Weight management and regular exercise should be initiated or continued. Return to [facility] Center for re-evaluation after 4 weeks  of therapy   Kara Mead MD Board Certified in [place]

## 2022-01-31 ENCOUNTER — Telehealth: Payer: Self-pay | Admitting: Adult Health

## 2022-01-31 NOTE — Telephone Encounter (Signed)
Patient is aware of results and voiced his understanding.  He will keep scheduled visit. Nothing further needed.

## 2022-01-31 NOTE — Telephone Encounter (Signed)
Split-night study done January 23, 2022 showed very severe sleep apnea with AHI at 112.8/hour and SpO2 low at 70%.  Titration portion of the study showed optimal control on 18 cm H2O.  Patient has an appointment February 08, 2022 please keep this appointment to discuss sleep study results and begin treatment plan

## 2022-02-08 ENCOUNTER — Encounter: Payer: Self-pay | Admitting: Nurse Practitioner

## 2022-02-08 ENCOUNTER — Ambulatory Visit: Payer: 59 | Admitting: Nurse Practitioner

## 2022-02-08 VITALS — BP 104/80 | HR 84 | Temp 98.0°F | Ht 69.0 in | Wt 358.0 lb

## 2022-02-08 DIAGNOSIS — J189 Pneumonia, unspecified organism: Secondary | ICD-10-CM

## 2022-02-08 DIAGNOSIS — G4733 Obstructive sleep apnea (adult) (pediatric): Secondary | ICD-10-CM | POA: Diagnosis not present

## 2022-02-08 NOTE — Assessment & Plan Note (Signed)
Traumatic rib fractures with subsequent pneumonia.  Currently on Levaquin 10-day course.  Advised him to complete as previously prescribed.  Encouraged him to work on mucociliary clearance therapies and deep breathing exercises.  He will obtain incentive spirometer.  We have placed orders for a flutter valve.  Follow-up with PCP for repeat imaging.  ED precautions advised.  He will notify us if his symptoms do not continue to improve.

## 2022-02-08 NOTE — Progress Notes (Signed)
Had 1 covid vaccine, had antiphylatic reaction.
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
## 2022-02-08 NOTE — Patient Instructions (Signed)
Start CPAP auto 15-20 cmH2O every night, minimum of 4-6 hours a night.  Change equipment every 30 days or as directed by DME. Wash your tubing with warm soap and water daily, hang to dry. Wash humidifier portion weekly.  Be aware of reduced alertness and do not drive or operate heavy machinery if experiencing this or drowsiness.  Exercise encouraged, as tolerated. Avoid or decrease alcohol consumption and medications that make you more sleepy, if possible. Notify if persistent daytime sleepiness occurs even with consistent use of CPAP.  We discussed how untreated sleep apnea puts an individual at risk for cardiac arrhthymias, pulm HTN, DM, stroke and increases their risk for daytime accidents.   Complete levofloxacin as prescribed by your primary care provider Follow up as scheduled for repeat chest x ray Use incentive spirometer 10x/hr while awake  Flutter valve 10 times 2-3 times a day Guaifenesin [phone] mg Twice daily to help clear secretions/chest congestion  Follow up in 8 weeks with Dr. Ander Slade (new pt 30 min slot) or Tammy Parrett,NP. If symptoms do not improve or worsen, please contact office for sooner follow up or seek emergency care.

## 2022-02-08 NOTE — Assessment & Plan Note (Signed)
Very severe OSA.  We discussed how untreated sleep apnea puts an individual at risk for cardiac arrhthymias, pulm HTN, DM, stroke and increases their risk for daytime accidents.  Discussed that appropriate treatment option include CPAP therapy.  He is willing to move forward with this.  We will start him on auto setting of 15-20 cmH2O.  Urgent new start sent today.  May need to make adjustments to this moving forward, which she understood.  He does have medium fullface mask at home.  I will send orders for him to be provided with a nasal mask to try as well.  He may need a chinstrap with this but we will reevaluate at follow-up.  Cautioned on safe driving practices.  Healthy weight loss encouraged.  Patient Instructions  Start CPAP auto 15-20 cmH2O every night, minimum of 4-6 hours a night.  Change equipment every 30 days or as directed by DME. Wash your tubing with warm soap and water daily, hang to dry. Wash humidifier portion weekly.  Be aware of reduced alertness and do not drive or operate heavy machinery if experiencing this or drowsiness.  Exercise encouraged, as tolerated. Avoid or decrease alcohol consumption and medications that make you more sleepy, if possible. Notify if persistent daytime sleepiness occurs even with consistent use of CPAP.  We discussed how untreated sleep apnea puts an individual at risk for cardiac arrhthymias, pulm HTN, DM, stroke and increases their risk for daytime accidents.   Complete levofloxacin as prescribed by your primary care provider Follow up as scheduled for repeat chest x ray Use incentive spirometer 10x/hr while awake  Flutter valve 10 times 2-3 times a day Guaifenesin [phone] mg Twice daily to help clear secretions/chest congestion  Follow up in 8 weeks with Dr. Ander Slade (new pt 30 min slot) or Tammy Parrett,NP. If symptoms do not improve or worsen, please contact office for sooner follow up or seek emergency care.

## 2022-03-25 ENCOUNTER — Ambulatory Visit: Payer: 59 | Admitting: Pulmonary Disease

## 2022-03-26 ENCOUNTER — Ambulatory Visit: Payer: No Typology Code available for payment source | Admitting: Adult Health

## 2023-09-02 ENCOUNTER — Telehealth: Payer: Self-pay | Admitting: *Deleted

## 2023-09-02 DIAGNOSIS — G4733 Obstructive sleep apnea (adult) (pediatric): Secondary | ICD-10-CM

## 2023-09-02 NOTE — Telephone Encounter (Signed)
 I went ahead and placed referral for CPAP supplies  I called the pt to offer sooner ov in [place], and the line rang and then turned to busy tone  Will try back later

## 2023-09-02 NOTE — Telephone Encounter (Signed)
 Copied from CRM [id number]. Topic: Clinical - Order For Equipment >> Sep 01, 2023  1:24 PM Isabell A wrote: Reason for CRM: Patient scheduled an appointment for 9/24 for a follow up for his CPAP supplies, he would like to discuss what he should in the mean time - in regard to getting his supplies.   Callback number: [phone]   I called and spoke with the pt  He is in need of new CPAP supplies- [employer]  He was last seen in 2023 and has upcoming visit with Katie on 11/05/23  He is asking if okay to go ahead and place order for the supplies  Katie, please advise if you are okay with this. I did let him know that there may be an issue with ins coverage since no recent visit

## 2023-09-02 NOTE — Telephone Encounter (Signed)
 Yes, ok to place. I can see sooner as well if he can do virtual or come to [place] office. Have openings in next few weeks. Thanks.

## 2023-09-05 ENCOUNTER — Telehealth: Payer: Self-pay | Admitting: Nurse Practitioner

## 2023-09-05 NOTE — Telephone Encounter (Signed)
 Per Arvella at [employer]-   We need OV face 2 face showing usage and benefit within the last year to process. I see in the notes he has upcoming OV set 2025. we will need the order resent at that time.

## 2023-09-09 NOTE — Telephone Encounter (Signed)
 I called and spoke to pt. Pt informed of Katie's note and verbalized understanding. Pt agreed to a sooner appt in [place] with Katie and I informed pt that the front office would call him to schedule the earlier appt. Pt verbalized understanding. Routing to the front desk, NFN

## 2023-09-09 NOTE — Telephone Encounter (Signed)
 PT has been scheduled.

## 2023-09-24 ENCOUNTER — Telehealth: Payer: Self-pay

## 2023-09-24 NOTE — Telephone Encounter (Signed)
 Oxygen supply form faxed to [phone]. Received fax confirmation.

## 2023-09-29 ENCOUNTER — Ambulatory Visit: Admitting: Nurse Practitioner

## 2023-10-06 ENCOUNTER — Encounter: Payer: Self-pay | Admitting: Nurse Practitioner

## 2023-10-06 ENCOUNTER — Ambulatory Visit: Admitting: Nurse Practitioner

## 2023-10-06 ENCOUNTER — Other Ambulatory Visit
Admission: RE | Admit: 2023-10-06 | Discharge: 2023-10-06 | Disposition: A | Discharge status: Home / Self Care | Source: Ambulatory Visit | Attending: Nurse Practitioner | Admitting: Nurse Practitioner

## 2023-10-06 ENCOUNTER — Telehealth: Payer: Self-pay

## 2023-10-06 ENCOUNTER — Other Ambulatory Visit: Payer: Self-pay | Admitting: Medical Genetics

## 2023-10-06 VITALS — BP 136/82 | HR 72 | Temp 98.5°F | Ht 69.0 in | Wt 357.4 lb

## 2023-10-06 DIAGNOSIS — Z6841 Body Mass Index (BMI) 40.0 and over, adult: Secondary | ICD-10-CM

## 2023-10-06 DIAGNOSIS — G4733 Obstructive sleep apnea (adult) (pediatric): Secondary | ICD-10-CM | POA: Insufficient documentation

## 2023-10-06 DIAGNOSIS — E66813 Obesity, class 3: Secondary | ICD-10-CM | POA: Diagnosis not present

## 2023-10-06 DIAGNOSIS — R0609 Other forms of dyspnea: Secondary | ICD-10-CM | POA: Diagnosis not present

## 2023-10-06 LAB — TSH: TSH: 4.269 u[IU]/mL (ref 0.350–4.500)

## 2023-10-06 LAB — HEMOGLOBIN A1C
Hgb A1c MFr Bld: 5.1 % (ref 4.8–5.6)
Mean Plasma Glucose: 99.67 mg/dL

## 2023-10-06 MED ORDER — ZEPBOUND 2.5 MG/0.5ML ~~LOC~~ SOAJ: 2.5000 mg | SUBCUTANEOUS | 0 refills | Status: AC

## 2023-10-06 NOTE — Telephone Encounter (Signed)
 Patient has severe sleep apnea related to obesity with BMI 52, posing significant cardiovascular risks. Patient has failed traditional weight loss measures with diet and exercise for >/6 months. Patient will be initiated on Zepbound  (tirzepatide ) for weight management. Zepbound  is the only pharmaceutical treatment approved for moderate-to-severe OSA in adults who are overweight (BMI >/27) or obese (BMI >/30). The patient will continue lifestyle modifications, including structured nutrition and physical activity as directed. No other GLP1 therapy will be used simultaneously at this time. The patient does not have any FDA labeled contraindications to this agent, including pregnancy, lactation, hx or family history of medullary thyroid cancer, or multiple endocrine neoplasia type II. Side effect profile has been reviewed with patient. Aware of red flag symptoms to notify of immediately or seek emergency care, including severe nausea/vomiting, inability to pass bowels or gas, severe abdominal pain/tenderness, jaundice.

## 2023-10-06 NOTE — Patient Instructions (Addendum)
 Continue to use CPAP every night, minimum of 4-6 hours a night.  Change equipment as directed. Wash your tubing with warm soap and water  daily, hang to dry. Wash humidifier portion weekly. Use bottled, distilled water  and change daily Be aware of reduced alertness and do not drive or operate heavy machinery if experiencing this or drowsiness.  Exercise encouraged, as tolerated. Healthy weight management discussed.  Avoid or decrease alcohol consumption and medications that make you more sleepy, if possible. Notify if persistent daytime sleepiness occurs even with consistent use of PAP therapy.  Change CPAP supplies... Every month Mask cushions and/or nasal pillows CPAP machine filters Every 3 months Mask frame (not including the headgear) CPAP tubing Every 6 months Mask headgear Chin strap (if applicable) Humidifier water  tub  We discussed how untreated sleep apnea puts an individual at risk for cardiac arrhthymias, pulm HTN, DM, stroke and increases their risk for daytime accidents. We also briefly reviewed treatment options including weight loss, side sleeping position, oral appliance, CPAP therapy or referral to ENT for possible surgical options  Start at 2.5 mg - inject one dose once a week for 4 weeks. After this and if you are doing well, we will increase you every 4 weeks with goal of getting to 10-15 mg once weekly for maintenance  Work on diet measures and exercise - 150 min/week. 30 minutes of strength training 3-5 days a week  We reviewed emergent symptoms to notify of immediately or seek emergency care, including severe nausea/vomiting, inability to pass bowels or gas, severe abdominal pain/tenderness, jaundice, swelling of the face/tongue.  Call if you are having difficulties with any side effects so we can help manage these   Follow up in one year with Izetta Rouleau, NP. If symptoms do not improve or worsen, please contact office for sooner follow up or seek emergency care.

## 2023-10-06 NOTE — Assessment & Plan Note (Signed)
 BMI 52. See above
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
## 2023-10-06 NOTE — Telephone Encounter (Signed)
 Copied from CRM [id number]. Topic: Clinical - Prescription Issue >> Oct 06, 2023 10:42 AM Russell PARAS wrote: Reason for CRM:   Pt was advised by pharmacy that his insurance will not cover the prescribed Zepbound . He is requesting call back to either discuss alternative medications or submitting PA through insurance for coverage of Zepbound   CB#  [phone]

## 2023-10-06 NOTE — Assessment & Plan Note (Signed)
 Improved. Residual DOE primarily related to body habitus. No inhaler use. No recent exacerbations or recurrent infections. See above

## 2023-10-06 NOTE — Assessment & Plan Note (Addendum)
 Very severe OSA.  Aware of risks of untreated OSA. Significant improvement in symptom burden with CPAP use. Excellent compliance and control. Receives benefit from use. Safe driving practices reviewed. Healthy weight loss encouraged.  Patient has severe  sleep apnea related to obesity with BMI >/30, posing significant cardiovascular risks. Patient has failed traditional weight loss measures with diet and exercise for >/6 months. Patient will be initiated on Zepbound  (tirzepatide ) for weight management. Zepbound  is the only pharmaceutical treatment approved for moderate-to-severe OSA in adults who are overweight (BMI >/27) or obese (BMI >/30). The patient will continue lifestyle modifications, including structured nutrition and physical activity as directed. No other GLP1 therapy will be used simultaneously at this time. The patient does not have any FDA labeled contraindications to this agent, including pregnancy, lactation, hx or family history of medullary thyroid cancer, or multiple endocrine neoplasia type II. Side effect profile has been reviewed with patient. Aware of red flag symptoms to notify of immediately or seek emergency care, including severe nausea/vomiting, inability to pass bowels or gas, severe abdominal pain/tenderness, jaundice.    Patient Instructions  Continue to use CPAP every night, minimum of 4-6 hours a night.  Change equipment as directed. Wash your tubing with warm soap and water  daily, hang to dry. Wash humidifier portion weekly. Use bottled, distilled water  and change daily Be aware of reduced alertness and do not drive or operate heavy machinery if experiencing this or drowsiness.  Exercise encouraged, as tolerated. Healthy weight management discussed.  Avoid or decrease alcohol consumption and medications that make you more sleepy, if possible. Notify if persistent daytime sleepiness occurs even with consistent use of PAP therapy.  Change CPAP supplies... Every  month Mask cushions and/or nasal pillows CPAP machine filters Every 3 months Mask frame (not including the headgear) CPAP tubing Every 6 months Mask headgear Chin strap (if applicable) Humidifier water  tub  We discussed how untreated sleep apnea puts an individual at risk for cardiac arrhthymias, pulm HTN, DM, stroke and increases their risk for daytime accidents. We also briefly reviewed treatment options including weight loss, side sleeping position, oral appliance, CPAP therapy or referral to ENT for possible surgical options  Start at 2.5 mg - inject one dose once a week for 4 weeks. After this and if you are doing well, we will increase you every 4 weeks with goal of getting to 10-15 mg once weekly for maintenance  Work on diet measures and exercise - 150 min/week. 30 minutes of strength training 3-5 days a week  We reviewed emergent symptoms to notify of immediately or seek emergency care, including severe nausea/vomiting, inability to pass bowels or gas, severe abdominal pain/tenderness, jaundice, swelling of the face/tongue.  Call if you are having difficulties with any side effects so we can help manage these   Follow up in one year with Izetta Rouleau, NP. If symptoms do not improve or worsen, please contact office for sooner follow up or seek emergency care.

## 2023-10-07 ENCOUNTER — Telehealth: Payer: Self-pay

## 2023-10-07 ENCOUNTER — Other Ambulatory Visit (HOSPITAL_COMMUNITY): Payer: Self-pay

## 2023-10-07 NOTE — Telephone Encounter (Signed)
 Pharmacy Patient Advocate Encounter   Received notification from Pt Calls Messages that prior authorization for Zepbound  2.5MG /0.5ML pen-injectors  is required/requested.   Insurance verification completed.   The patient is insured through [facility] .   Per test claim: PA required; PA submitted to above mentioned insurance via Latent Key/confirmation #/EOC [facility] Status is pending

## 2023-10-08 NOTE — Telephone Encounter (Signed)
 Pharmacy Patient Advocate Encounter  Received notification from [employer] that Prior Authorization for  Zepbound  2.5MG /0.5ML pen-injectors  has been DENIED.  See denial reason below. No denial letter attached in CMM. Will attach denial letter to Media tab once received.   PA #/Case ID/Reference #: [id number]

## 2023-10-08 NOTE — Telephone Encounter (Signed)
 Please have pharmacy team do an appeal utilizing verbiage below  Patient has severe sleep apnea related to obesity with BMI >/30, posing significant cardiovascular risks. Patient has failed traditional weight loss measures with diet and exercise for >/6 months. Patient will be initiated on Zepbound  (tirzepatide ) for weight management. Zepbound  is the only pharmaceutical treatment approved for moderate-to-severe OSA in adults who are overweight (BMI >/27) or obese (BMI >/30). The patient will continue lifestyle modifications, including structured nutrition and physical activity as directed.

## 2023-10-08 NOTE — Telephone Encounter (Signed)
 Duplicate

## 2023-10-08 NOTE — Telephone Encounter (Signed)
 Telephone Open 10/07/2023 [facility], [name], CPhT Pharmacy Technician Non-specialty Med Biv  Reason for conversation  All Conversations: Non-specialty Med Biv (Newest Message First)            Richard Dyer    10/07/23  9:28 AM Note Pharmacy Patient Advocate Encounter   Received notification from Pt Calls Messages that prior authorization for Zepbound  2.5MG /0.5ML pen-injectors  is required/requested.   Insurance verification completed.   The patient is insured through [facility] .   Per test claim: PA required; PA submitted to above mentioned insurance via Latent Key/confirmation #/EOC [facility] Status is pending

## 2023-10-09 ENCOUNTER — Telehealth: Payer: Self-pay | Admitting: Pharmacist

## 2023-10-09 ENCOUNTER — Other Ambulatory Visit

## 2023-10-09 NOTE — Telephone Encounter (Signed)
 Appeal has been submitted for Zepbound . Will advise when response is received, please be advised that most companies may take 30 days to make a decision. Appeal letter and documentation have been faxed to [phone] on 10/09/2023 @3 :36 pm.  Thank you, Devere Pandy, PharmD Clinical Pharmacist  [place]  Direct Dial: [phone]

## 2023-10-14 ENCOUNTER — Other Ambulatory Visit (HOSPITAL_COMMUNITY): Payer: Self-pay

## 2023-10-20 ENCOUNTER — Other Ambulatory Visit (HOSPITAL_COMMUNITY): Payer: Self-pay

## 2023-10-21 ENCOUNTER — Other Ambulatory Visit (HOSPITAL_COMMUNITY): Payer: Self-pay

## 2023-11-03 ENCOUNTER — Other Ambulatory Visit (HOSPITAL_COMMUNITY): Payer: Self-pay

## 2023-11-05 ENCOUNTER — Ambulatory Visit: Admitting: Nurse Practitioner

## 2023-11-07 ENCOUNTER — Other Ambulatory Visit (HOSPITAL_COMMUNITY): Payer: Self-pay

## 2023-11-07 NOTE — Telephone Encounter (Signed)
 Additional information requested and faxed [phone]

## 2023-11-10 ENCOUNTER — Other Ambulatory Visit (HOSPITAL_COMMUNITY): Payer: Self-pay

## 2023-11-17 ENCOUNTER — Other Ambulatory Visit (HOSPITAL_COMMUNITY): Payer: Self-pay

## 2023-11-18 ENCOUNTER — Other Ambulatory Visit (HOSPITAL_COMMUNITY): Payer: Self-pay

## 2023-11-18 NOTE — Telephone Encounter (Signed)
 Appeal for Zepbound  has been denied by the insurance, complete letter can be found under the media tab.

## 2023-11-20 NOTE — Telephone Encounter (Signed)
 He can call and discuss with his insurance provider but they denied the appeal. Only option is for self pay, which is max $499 out of pocket monthly. If he would like to move forward with this, we can send the order to th [facility]. Thanks.

## 2023-11-20 NOTE — Telephone Encounter (Signed)
 Patient advised. Patient reports that insurance denied appeal, due to no proof of compliance with CPAP. I have downloaded 30 and 90 compliance and faxed to pharmacy team. 2nd appeal will be attempted.

## 2023-11-21 ENCOUNTER — Telehealth: Payer: Self-pay | Admitting: Pharmacist

## 2023-11-21 NOTE — Telephone Encounter (Signed)
 Appeal has been submitted. Will advise when response is received, please be advised that most companies may take 30 days to make a decision. Appeal letter and supporting documentation, including CPAP compliance reports,  have been faxed to [phone] on 11/21/2023 @2 :39 pm.  Thank you, Devere Pandy, PharmD Clinical Pharmacist  [place]  Direct Dial: [phone]

## 2023-12-02 NOTE — Telephone Encounter (Signed)
 Received fax from the [employer] stating fax appeal has been denied

## 2023-12-03 ENCOUNTER — Other Ambulatory Visit (HOSPITAL_COMMUNITY): Payer: Self-pay

## 2023-12-16 ENCOUNTER — Other Ambulatory Visit: Payer: Self-pay | Admitting: Medical Genetics

## 2023-12-16 DIAGNOSIS — Z006 Encounter for examination for normal comparison and control in clinical research program: Secondary | ICD-10-CM
